# Patient Record
Sex: Male | Born: 1970 | Race: White | Hispanic: No | Marital: Married | Smoking: Former smoker
Health system: Southern US, Community
[De-identification: ages and names within clinical notes are randomized; demographics above are authoritative.]

## PROBLEM LIST (undated history)

## (undated) DIAGNOSIS — R7302 Impaired glucose tolerance (oral): Secondary | ICD-10-CM

## (undated) DIAGNOSIS — F419 Anxiety disorder, unspecified: Secondary | ICD-10-CM

## (undated) DIAGNOSIS — F32A Depression, unspecified: Secondary | ICD-10-CM

## (undated) DIAGNOSIS — F329 Major depressive disorder, single episode, unspecified: Secondary | ICD-10-CM

## (undated) DIAGNOSIS — G4730 Sleep apnea, unspecified: Secondary | ICD-10-CM

## (undated) DIAGNOSIS — E785 Hyperlipidemia, unspecified: Secondary | ICD-10-CM

## (undated) DIAGNOSIS — K219 Gastro-esophageal reflux disease without esophagitis: Secondary | ICD-10-CM

## (undated) HISTORY — DX: Gastro-esophageal reflux disease without esophagitis: K21.9

## (undated) HISTORY — DX: Gilbert syndrome: E80.4

## (undated) HISTORY — DX: Major depressive disorder, single episode, unspecified: F32.9

## (undated) HISTORY — DX: Impaired glucose tolerance (oral): R73.02

## (undated) HISTORY — DX: Anxiety disorder, unspecified: F41.9

## (undated) HISTORY — DX: Depression, unspecified: F32.A

## (undated) HISTORY — PX: BACK SURGERY: SHX140

## (undated) HISTORY — DX: Hyperlipidemia, unspecified: E78.5

## (undated) HISTORY — PX: FINGER SURGERY: SHX640

## (undated) HISTORY — DX: Sleep apnea, unspecified: G47.30

---

## 2001-02-24 ENCOUNTER — Emergency Department (HOSPITAL_COMMUNITY): Admission: EM | Admit: 2001-02-24 | Discharge: 2001-02-24 | Payer: Self-pay | Admitting: Emergency Medicine

## 2001-02-27 ENCOUNTER — Ambulatory Visit (HOSPITAL_COMMUNITY): Admission: RE | Admit: 2001-02-27 | Discharge: 2001-02-27 | Payer: Self-pay | Admitting: Orthopedic Surgery

## 2001-02-27 ENCOUNTER — Encounter: Payer: Self-pay | Admitting: Orthopedic Surgery

## 2001-03-03 ENCOUNTER — Ambulatory Visit (HOSPITAL_COMMUNITY): Admission: RE | Admit: 2001-03-03 | Discharge: 2001-03-03 | Payer: Self-pay | Admitting: Neurosurgery

## 2001-03-03 ENCOUNTER — Encounter: Payer: Self-pay | Admitting: Neurosurgery

## 2003-03-26 ENCOUNTER — Ambulatory Visit (HOSPITAL_BASED_OUTPATIENT_CLINIC_OR_DEPARTMENT_OTHER): Admission: RE | Admit: 2003-03-26 | Discharge: 2003-03-26 | Payer: Self-pay | Admitting: Orthopedic Surgery

## 2008-11-20 ENCOUNTER — Emergency Department (HOSPITAL_COMMUNITY): Admission: EM | Admit: 2008-11-20 | Discharge: 2008-11-20 | Payer: Self-pay | Admitting: Emergency Medicine

## 2010-02-08 ENCOUNTER — Encounter: Payer: Self-pay | Admitting: Emergency Medicine

## 2010-06-05 NOTE — Op Note (Signed)
[place]. [facility]  Patient:    Derrick Rogers, Derrick Rogers Visit Number: [id number] MRN: [id number]          Service Type: DSU Location: 3000 [id number] [id number] Attending Physician:  Donn Pierini Dictated by:   Julio Sicks, M.D. Proc. Date: 03/03/01 Admit Date:  03/03/2001 Discharge Date: 03/03/2001                             Operative Report  PREOPERATIVE DIAGNOSIS:  Left L5-S1 herniated nucleus pulposus with radiculopathy.  POSTOPERATIVE DIAGNOSIS:  Left L5-S1 herniated nucleus pulposus with radiculopathy.  PROCEDURE:  Left L5-S1 laminotomy and microdiskectomy.  SURGEON:  Julio Sicks, M.D.  ANESTHESIA:  General endotracheal.  INDICATIONS:  The patient is a 40 year old male with a history of severe back and left lower extremity pain, paresthesia, and weakness with a left-sided S1 radiculopathy which has failed conservative management.  MRI scan demonstrates a left L5-S1 large paracentral disk herniation with compression of the left-sided S1 nerve root.  We discussed the options as well as management including the possibility of undergoing a left-sided L5-S1 laminotomy and microdiskectomy in hopes of improving his symptoms.  The patient is aware of the risks and benefits and wishes to proceed.  DESCRIPTION OF PROCEDURE:  The patient was taken to the operating room and placed on the operating table in the supine position.  After an adequate level of anesthesia was achieved, the patient was positioned prone on to a Wilson frame, appropriately padded, and preparation.  The lumbar region is prepped and draped sterilely.  A linear skin incision was made overlying the L5-S1 interspace.  This was carried down sharply in the midline. Subperiosteal dissection was then performed including the lamina and facet joints of L5 and S1.  Deep self-retaining retractor was placed. Intraoperative x-ray was taken and the level was confirmed.  A wide laminotomy was then performed  using Kerrison rongeurs and the high speed drill to remove the anterior one-third of the lamina of L5 and the medial one-third of the L5-S1 facet joint and the superior rim of the S1 lamina.  The ligamentum flavum was then elevated and protected in the usual fashion using the Kerrison rongeurs where the underlying thecal sac and exiting S1 nerve root were identified.  The microscope was brought into the field and used for microdissection.  The left-sided S1 nerve root and underlying disk herniation with the epidural venous plexus was coagulated and cut.  The thecal sac and S1 nerve root were mobilized and retracted towards the midline.  Disk herniation was readily apparent.  The disk itself was tightly adherent to the overlying nerve root.  This was dissected free the best we could.  The disk was then incised with a 15 blade in a rectangular fashion.  A wide disk space clean out was then achieved using pituitary rongeurs and upward angled pituitary rongeurs, and Epstein curets.  The disk herniation itself was very tough and fibrous.  It was removed completely.  All loose or obviously degenerative disk material was removed from the interspace.  At this point, a very thorough decompression was achieved.  There was no residual in the thecal sac or nerve roots.  The wound was then irrigated with antibiotic solution.  Gelfoam was placed topically for hemostasis and found to be good.  The microscope and retraction system removed.  Hemostasis in the muscle was achieved with electrocautery.  The wound was closed in layers with Vicryl  suture.  Steri-Strips and a sterile dressing were applied.  There were no apparent complications.  The patient tolerated the procedure well and he returns to the recovery room postoperative. Dictated by:   Julio Sicks, M.D. Attending Physician:  Donn Pierini DD:  03/03/01 TD:  03/04/01 Job: [id number]

## 2010-06-05 NOTE — Op Note (Signed)
NAMEJONH, MCQUEARY                         ACCOUNT NO.:  [id number]   MEDICAL RECORD NO.:  [id number]                   PATIENT TYPE:  AMB   LOCATION:  DSC                                  FACILITY:  [facility]   PHYSICIAN:  Cindee Salt, M.D.                    DATE OF BIRTH:  10-Jan-1971   DATE OF PROCEDURE:  03/26/2003  DATE OF DISCHARGE:                                 OPERATIVE REPORT   PREOPERATIVE DIAGNOSIS:  Fractured middle phalanx, left index finger.   POSTOPERATIVE DIAGNOSIS:  Fractured middle phalanx, left index finger.   OPERATION:  Open reduction and internal fixation of middle phalanx fracture,  left index finger.   SURGEON:  Cindee Salt, M.D.   ASSISTANTCarolyne Fiscal   ANESTHESIA:  General.   HISTORY:  The patient is a 40 year old male who suffered a crush injury to  his left index finger.  He is brought in now for attempted closed and  possible open reduction.   DESCRIPTION OF PROCEDURE:  The patient was brought to the operating room  where a general anesthetic was carried out without difficulty.  He was  prepped using DuraPrep in a supine position, left arm free.  The fracture  was manipulated.  This was unsuccessful.  Two K-wires were placed into the  proximal portion of the middle phalanx.  These were used to attempt to  manipulate the fracture further.  This was unsuccessful using the image  intensifier.  A 35 K-wire was passed into the middle phalanx distal to the  fracture to allow this to be used as a joystick.  This was also unsuccessful  in reducing the fracture.  As such, it was decided to proceed with opening  the fracture site for placement.  A longitudinal incision was made over the  proximal aspect of the middle phalanx and carried down through the  subcutaneous tissue.  The bare area was opened.  The fracture was  immediately apparent.  A Freer elevator was then used to manipulate the  fracture.  This allowed it to be manipulated back into position in an  AP and  lateral direction.  The two K-wires were then passed across the fracture  site stabilizing it in the AP lateral direction.  The 35 K-wire was removed.  The pins were bent and cut short.  The wound was irrigated.  The skin was  closed with interrupted 5-0 nylon sutures.  A sterile compressive dressing  and splint to the finger was applied.  The patient tolerated the procedure  well and was taken to the recovery room for observation in satisfactory  condition.  He is discharged home to return to the [facility] in [place]  in one week on Vicodin and Keflex.  Cindee Salt, M.D.    Angelique Blonder  D:  03/26/2003  T:  03/26/2003  Job:  [id number]

## 2011-11-23 ENCOUNTER — Other Ambulatory Visit: Payer: Self-pay | Admitting: Physician Assistant

## 2011-11-23 ENCOUNTER — Ambulatory Visit
Admission: RE | Admit: 2011-11-23 | Discharge: 2011-11-23 | Disposition: A | Discharge status: Home / Self Care | Payer: Self-pay | Source: Ambulatory Visit | Attending: Physician Assistant | Admitting: Physician Assistant

## 2011-11-23 DIAGNOSIS — R200 Anesthesia of skin: Secondary | ICD-10-CM

## 2013-04-22 ENCOUNTER — Emergency Department (INDEPENDENT_AMBULATORY_CARE_PROVIDER_SITE_OTHER): Payer: BC Managed Care – PPO

## 2013-04-22 ENCOUNTER — Encounter (HOSPITAL_COMMUNITY): Payer: Self-pay | Admitting: Emergency Medicine

## 2013-04-22 ENCOUNTER — Emergency Department (INDEPENDENT_AMBULATORY_CARE_PROVIDER_SITE_OTHER)
Admission: EM | Admit: 2013-04-22 | Discharge: 2013-04-22 | Disposition: A | Discharge status: Home / Self Care | Payer: BC Managed Care – PPO | Source: Home / Self Care | Attending: Emergency Medicine | Admitting: Emergency Medicine

## 2013-04-22 DIAGNOSIS — M67919 Unspecified disorder of synovium and tendon, unspecified shoulder: Secondary | ICD-10-CM

## 2013-04-22 DIAGNOSIS — M719 Bursopathy, unspecified: Secondary | ICD-10-CM

## 2013-04-22 DIAGNOSIS — M7580 Other shoulder lesions, unspecified shoulder: Secondary | ICD-10-CM

## 2013-04-22 MED ORDER — DICLOFENAC SODIUM 75 MG PO TBEC: 75.0000 mg | DELAYED_RELEASE_TABLET | Freq: Two times a day (BID) | ORAL | Status: DC

## 2013-04-22 NOTE — Discharge Instructions (Signed)

## 2013-04-22 NOTE — ED Provider Notes (Signed)
  Chief Complaint   Chief Complaint  Patient presents with  . Shoulder Pain    History of Present Illness   Derrick Rogers is a 43 year old male had a two-day history of left shoulder pain both superiorly and posteriorly. He denies any injury. The pain is worse with any movement of the shoulder including abduction, flexion, and internal and external rotation. There is no radiation down the arm, no numbness, tingling, weakness, or swelling of the arm or hand.  Review of Systems   Other than as noted above, the patient denies any of the following symptoms: Systemic:  No fevers or chills. Musculoskeletal:  No joint pain, arthritis, swelling, back pain, or neck pain. No history of arthritis.  Neurological:  No muscular weakness or paresthesia.  PMFSH   Past medical history, family history, social history, meds, and allergies were reviewed.    Physical Examination     Vital signs:  BP 143/91  Pulse 81  Temp(Src) 98.3 F (36.8 C) (Oral)  Resp 18  SpO2 98% Gen:  Alert and oriented times 3.  In no distress. Musculoskeletal: No pain to palpation. Shoulder has a full range of motion with pain on abduction and flexion. Also has pain with internal and external rotation. Neer test was positive.  Hawkins test was positive.  Empty cans test was negative. Otherwise, all joints had a full a ROM with no swelling, bruising or deformity.  No edema, pulses full. Extremities were warm and pink.  Capillary refill was brisk.  Skin:  Clear, warm and dry.  No rash. Neuro:  Alert and oriented times 3.  Muscle strength was normal.  Sensation was intact to light touch.   Radiology   Dg Shoulder Left  04/22/2013   CLINICAL DATA:  Left shoulder pain  EXAM: LEFT SHOULDER - 2+ VIEW  COMPARISON:  None  FINDINGS: Four views of left shoulder submitted. No acute fracture or subluxation. Mild degenerative changes left AC joint. The glenohumeral joint is preserved.  IMPRESSION: No acute fracture or subluxation. Mild  degenerative changes left AC joint.   Electronically Signed   By: Natasha MeadLiviu  Pop M.D.   On: 04/22/2013 10:53   I reviewed the images independently and personally and concur with the radiologist's findings.  Assessment   The encounter diagnosis was Rotator cuff tendonitis.  He declines a corticosteroid injection today, so we'll treat conservatively with anti-inflammatories, rest, ice, and exercise.  Plan     1.  Meds:  The following meds were prescribed:   Discharge Medication List as of 04/22/2013 11:15 AM    START taking these medications   Details  diclofenac (VOLTAREN) 75 MG EC tablet Take 1 tablet (75 mg total) by mouth 2 (two) times daily., Starting 04/22/2013, Until Discontinued, Normal        2.  Patient Education/Counseling:  The patient was given appropriate handouts, self care instructions, and instructed in symptomatic relief.  Given exercises to do twice a day.  3.  Follow up:  The patient was told to follow up here if no better in 3 to 4 days, or sooner if becoming worse in any way, and given some red flag symptoms such as worsening pain or new neurological symptoms which would prompt immediate return.  Followup with Dr. Jene EveryJeffrey Beane if no improvement in 2 weeks.     Reuben Likesavid C Keali Mccraw, MD 04/22/13 [phone]

## 2013-04-22 NOTE — ED Notes (Addendum)
Pt c/o left shoulder pain x 2 days. Pt reports pain is gradually getting worse and rom is becoming more limited. 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 Also reports sometimes he swings on a bar above the steps and skips steps and jumps down. Pt has not tried anything for pain. Pt is alert and oriented and in no acute distress.

## 2013-09-11 ENCOUNTER — Encounter: Payer: Self-pay | Admitting: General Surgery

## 2013-09-18 ENCOUNTER — Ambulatory Visit (INDEPENDENT_AMBULATORY_CARE_PROVIDER_SITE_OTHER): Payer: BC Managed Care – PPO | Admitting: Cardiology

## 2013-09-18 ENCOUNTER — Encounter: Payer: Self-pay | Admitting: Cardiology

## 2013-09-18 VITALS — BP 120/84 | HR 71 | Ht 73.0 in | Wt 263.0 lb

## 2013-09-18 DIAGNOSIS — G4733 Obstructive sleep apnea (adult) (pediatric): Secondary | ICD-10-CM | POA: Insufficient documentation

## 2013-09-18 DIAGNOSIS — E669 Obesity, unspecified: Secondary | ICD-10-CM

## 2013-09-18 NOTE — Patient Instructions (Signed)
Your physician recommends that you continue on your current medications as directed. Please refer to the Current Medication list given to you today.  Your physician has recommended that you have a sleep study. This test records several body functions during sleep, including: brain activity, eye movement, oxygen and carbon dioxide blood levels, heart rate and rhythm, breathing rate and rhythm, the flow of air through your mouth and nose, snoring, body muscle movements, and chest and belly movement. (Schedule at [facility] for Dr Mayford Knife to read)  Polysomnography (Sleep Studies) Polysomnography (PSG) is a series of tests used for detecting (diagnosing) obstructive sleep apnea and other sleep disorders. The tests measure how some parts of your body are working while you are sleeping. The tests are extensive and expensive. They are done in a sleep lab or hospital, and vary from center to center. Your caregiver may perform other more simple sleep studies and questionnaires before doing more complete and involved testing. Testing may not be covered by insurance. Some of these tests are:  An EEG (Electroencephalogram). This tests your brain waves and stages of sleep.  An EOG (Electrooculogram). This measures the movements of your eyes. It detects periods of REM (rapid eye movement) sleep, which is your dream sleep.  An EKG (Electrocardiogram). This measures your heart rhythm.  EMG (Electromyography). This is a measurement of how the muscles are working in your upper airway and your legs while sleeping.  An oximetry measurement. It measures how much oxygen (air) you are getting while sleeping.  Breathing efforts may be measured. The same test can be interpreted (understood) differently by different caregivers and centers that study sleep.  Studies may be given an apnea/hypopnea index (AHI). This is a number which is found by counting the times of no breathing or under breathing during the night, and  relating those numbers to the amount of time spent in bed. When the AHI is greater than 15, the patient is likely to complain of daytime sleepiness. When the AHI is greater than 30, the patient is at increased risk for heart problems and must be followed more closely. Following the AHI also allows you to know how treatment is working. Simple oximetry (tracking the amount of oxygen that is taken in) can be used for screening patients who:  Do not have symptoms (problems) of OSA.  Have a normal Epworth Sleepiness Scale Score.  Have a low pre-test probability of having OSA.  Have none of the upper airway problems likely to cause apnea.  Oximetry is also used to determine if treatment is effective in patients who showed significant desaturations (not getting enough oxygen) on their home sleep study. One extra measure of safety is to perform additional studies for the person who only snores. This is because no one can predict with absolute certainty who will have OSA. Those who show significant desaturations (not getting enough oxygen) are recommended to have a more detailed sleep study. Document Released: 07/11/2002 Document Revised: 03/29/2011 Document Reviewed: 03/12/2013 [facility] Patient Information 2015 [place], [place]. This information is not intended to replace advice given to you by your health care provider. Make sure you discuss any questions you have with your health care provider.

## 2013-09-18 NOTE — Progress Notes (Signed)
[address] [place], [place]  [address] Phone: [phone] Fax:  [phone]  Date:  09/18/2013   ID:  Derrick Rogers, DOB 09/16/1970, MRN [id number]  PCP:  Cala Bradford, MD  Sleep Medicine:  Armanda Magic, MD     History of Present Illness: Derrick Rogers is a 43 y.o. male with a history of OSA and tried CPAP but did not sleep well with it so he stopped it about a month later. His insurance would not cover a machine and was borrowing one.   He is now wanting to start back on CPAP.  He says that he would like to try a full face mask.  He does not feel rested when he gets up in the am.  He says that he is sleepy around 2pm every day.  He is snoring as well as night according to his wife.     Wt Readings from Last 3 Encounters:  09/18/13 263 lb (119.296 kg)     Past Medical History  Diagnosis Date  . Anxiety   . Depression   . GERD (gastroesophageal reflux disease)     good control  . Hyperlipidemia   . Glucose intolerance (impaired glucose tolerance)   . Sleep apnea     Moderate with AHI 20/hr now on no CPAP machine from a cost issue-chronic daytime fatigue, tried the machine adn found he did not sleep well with it.   . Gilbert's syndrome     Current Outpatient Prescriptions  Medication Sig Dispense Refill  . acetaminophen (TYLENOL) 500 MG tablet Take 500 mg by mouth every 6 (six) hours as needed.      Marland Kitchen escitalopram (LEXAPRO) 10 MG tablet Take 10 mg by mouth daily.      Marland Kitchen omeprazole (PRILOSEC) 20 MG capsule Take 20 mg by mouth as needed.       No current facility-administered medications for this visit.    Allergies:   No Known Allergies  Social History:  The patient  reports that he quit smoking about 2 years ago. He does not have any smokeless tobacco history on file. He reports that he drinks alcohol. He reports that he does not use illicit drugs.   Family History:  The patient's family history includes CAD in his father and paternal grandfather;  Dementia in his paternal grandmother; Heart attack in his paternal grandfather; Hyperlipidemia in his brother; Hypertension in his brother; Melanoma in his father.   ROS:  Please see the history of present illness.      All other systems reviewed and negative.   PHYSICAL EXAM: VS:  BP 120/84  Pulse 71  Ht  (1.854 m)  Wt 263 lb (119.296 kg)  BMI 34.71 kg/m2 Well nourished, well developed, in no acute distress HEENT: normal Neck: no JVD Cardiac:  normal S1, S2; RRR; no murmur Lungs:  clear to auscultation bilaterally, no wheezing, rhonchi or rales Abd: soft, nontender, no hepatomegaly Ext: no edema Skin: warm and dry Neuro:  CNs 2-12 intact, no focal abnormalities noted       ASSESSMENT AND PLAN:  1. OSA on CPAP originally but then stopped a few years ago.  He has symptoms of daytime sleepiness, snoring and poor sleep. - set up split night PSG 2. Obesity - I encouraged him to try to get into a routine aerobic exercise program and try to follow a low fat diet.  Followup with me after CPAP started  Signed, Armanda Magic, MD 09/18/2013 10:05 AM

## 2013-11-02 ENCOUNTER — Ambulatory Visit (HOSPITAL_BASED_OUTPATIENT_CLINIC_OR_DEPARTMENT_OTHER): Payer: BC Managed Care – PPO | Attending: Cardiology

## 2013-11-02 VITALS — Ht 73.0 in | Wt 263.0 lb

## 2013-11-02 DIAGNOSIS — G4733 Obstructive sleep apnea (adult) (pediatric): Secondary | ICD-10-CM | POA: Diagnosis not present

## 2013-11-02 DIAGNOSIS — Z6834 Body mass index (BMI) 34.0-34.9, adult: Secondary | ICD-10-CM | POA: Insufficient documentation

## 2013-11-02 DIAGNOSIS — R0683 Snoring: Secondary | ICD-10-CM | POA: Diagnosis present

## 2013-11-02 DIAGNOSIS — R400 Somnolence: Secondary | ICD-10-CM | POA: Diagnosis present

## 2013-11-09 ENCOUNTER — Telehealth: Payer: Self-pay | Admitting: Cardiology

## 2013-11-09 NOTE — Telephone Encounter (Signed)
Please let patient know that he has moderate sleep apnea and did well on his CPAP titration.  He will be set up with [facility]AP [facility] and I have put the order in.  Please set him up for an OV with me in 10 weeks

## 2013-11-09 NOTE — Telephone Encounter (Signed)
Patient informed he has moderate sleep apnea and did well on his CPAP titration.  He will be set up with CPAP through [facility]. 10 week F/U scheduled 01/24/2013 at 1000. Patient agrees with treatment plan.

## 2013-11-09 NOTE — Sleep Study (Signed)
   NAME: Derrick Rogers DATE OF BIRTH:  [birth year] MEDICAL RECORD NUMBER [id number]  LOCATION: [facility]  PHYSICIAN: Leannah Guse R  DATE OF STUDY: 11/02/2013  SLEEP STUDY TYPE: Nocturnal Polysomnogram               REFERRING PHYSICIAN: Quintella Reicherturner, Trinka Keshishyan R, MD  INDICATION FOR STUDY: daytime sleepiness, loud snoring, falling asleep driving  EPWORTH SLEEPINESS SCORE: 12 HEIGHT: 6\' 1"  (185.4 cm)  WEIGHT: 263 lb (119.296 kg)    Body mass index is 34.71 kg/(m^2).  NECK SIZE: 17 in. BMI: 35  MEDICATIONS: Reviewed in the chart  SLEEP ARCHITECTURE: The patient slept for a total of 151 minutes with no slow wave sleep and only 1 minute of REM sleep.  Sleep onset latency was normal at 8 minutes and latency to REM onset was delayed at 153 minutes.  The sleep efficiency was normal at 93%.  During the CPAP titration, the total sleep time was 254 minutes with no slow wave sleep but 95 minutes of REM sleep.  The sleep efficiency was 96%.  RESPIRATORY DATA: The patient had an AHI of 15.1 events per hour of which 3 were obstructive apneas and and 35 were obstructive hypopneas.  Most occurred during NREM sleep and in the nonsupine position.  During the CPAP titration, there was 3 central apneas, 2 obstructive apneas and 23 hypopneas.  The patient was titrated on CPAP starting at 4cm H2O and increased to 10cm H2O.  At 10cm H2O the AHI was 1.1 event per hour.  The patient was able to sleep for prolonged periods of time in REM sleep in the supine position without further respiratory events at an optimum pressure of 10cm H2O.  OXYGEN DATA: Baseline O2 sats were 92% and during the PSG dropped to 90% during REM sleep and 77% in NREM sleep.  During the CPAP titration the lowest O2 sat at optimum pressure of 10cm H2O was 91%.  The lowest O2 sat during the titration was 88%.    CARDIAC DATA: There patient maintained NSR with occasioanl PVC's during the study.  MOVEMENT/PARASOMNIA: There were no  were no periodic limb movements or behavior disorders noted during sleep.  IMPRESSION/ RECOMMENDATION:   1.  Mild to moderate obstructive sleep apnea/hypopnea syndrome with an overall AHI of 15.1 events per hour.  Most events were noted during NREM sleep in the non supine position.   2.  Respiratory related O2 desaturations were noted as low as 77% during NREM sleep.   3.  Successful CPAP titration to 10cm H2O using a Medium Simplus Full Face mask with AHI 1.1 events per hour and lowest O2 sat the this pressure of 91%. 4.  The patient should be counseled in good sleep hygiene and weight loss.    Signed: Quintella ReichertURNER,Astin Sayre R Diplomate, American Board of Sleep Medicine  ELECTRONICALLY SIGNED ON:  11/09/2013, 9:19 AM [facility] PH: [phone]   FX: [phone] ACCREDITED BY THE AMERICAN ACADEMY OF SLEEP MEDICINE

## 2013-11-26 ENCOUNTER — Telehealth: Payer: Self-pay | Admitting: Cardiology

## 2013-11-26 NOTE — Telephone Encounter (Signed)
Hasn't heard from [facility] about his CPap supplies. Says its been a few weeks already since you called him.

## 2013-11-26 NOTE — Telephone Encounter (Signed)
New problem   Pt need to speak to nurse concerning his recent sleep study. [facility] was [facility] and he haven't heard from them.  Please call pt.

## 2013-11-27 NOTE — Telephone Encounter (Signed)
Sending message to Derrick [facility]issa [facility] at [facility] to [facility].

## 2013-11-29 ENCOUNTER — Telehealth: Payer: Self-pay

## 2013-11-29 NOTE — Telephone Encounter (Signed)
Concerned they had not heard from [facility] about CPAP supplies.   Forwarding to J. C. PenneyMelissa Stenson for follow-up.

## 2013-12-25 ENCOUNTER — Telehealth: Payer: Self-pay | Admitting: Cardiology

## 2013-12-25 DIAGNOSIS — G4733 Obstructive sleep apnea (adult) (pediatric): Secondary | ICD-10-CM

## 2013-12-25 NOTE — Telephone Encounter (Signed)
New message      Pt brought [place] CPAP machine from [place] or somewhere.  He did not get it from the home health people.  He needs to know the settings he need to put the CPAP machine on.  Please call

## 2013-12-26 ENCOUNTER — Telehealth: Payer: Self-pay | Admitting: General Practice

## 2013-12-26 NOTE — Telephone Encounter (Signed)
Pt's wife st that [employer] S8 Elite. Informed her I'd call tomorrow after talking to Dr. Mayford Knifeurner.

## 2013-12-26 NOTE — Telephone Encounter (Signed)
To Dr. Turner for review and recommendations. 

## 2013-12-26 NOTE — Telephone Encounter (Signed)
New message         Pt would like to know the settings to the cpap machine / please give pt a call

## 2013-12-27 NOTE — Telephone Encounter (Signed)
10cm H2O

## 2013-12-27 NOTE — Telephone Encounter (Signed)
Left message on patient's private VM to set the machine at 10 cm H2O.  Instructed patient to call tomorrow if he has any questions.

## 2014-01-08 NOTE — Telephone Encounter (Signed)
Please fine out who his DME is and set up an Appt with them to check machine

## 2014-01-08 NOTE — Telephone Encounter (Signed)
Left message to call back  

## 2014-01-08 NOTE — Telephone Encounter (Signed)
To Dr. Turner for recommendations. 

## 2014-01-08 NOTE — Telephone Encounter (Signed)
Follow Up         Pt calling stating he has some questions about his CPAP machine. Pt states that every time he uses his machine he wakes up the next morning with flu like symptoms. Please call back and advise.

## 2014-01-09 NOTE — Telephone Encounter (Signed)
Pt's wife called to inform us that patient was diagnosed with an upper respiratory infection today and wants to know if it's from the CPAP. She st that they do not have a DME here -- [facility] but [facility] were "too unorganized and were going to take too long and were too expensive." They had their CPAP sent to them from a member of the family who is a sleep doctor in [place] to save money. Pt's wife angry that we do not check the machines at the office. Explained to her that the DME is utilized to troubleshoot machine problems, not us.  Pt's wife st that the patient thinks he is still snoring and wants to know if the settings can be adjusted and if so, by how much and at what increments.   To Dr. Mayford Knifeurner for review and recommendations.

## 2014-01-14 NOTE — Telephone Encounter (Signed)
Please order a 2 week CPAP autotitration from 4 to 20cm H2O - but this will need to be done [facility]

## 2014-01-14 NOTE — Telephone Encounter (Signed)
Informed patient of Dr. Norris Crossurner's recommendations to have 2 week CPAP autotitration from 4 to 20 cm H2O [facility]. Patient agrees with treatment plan.  Order placed and [facility].

## 2014-01-17 ENCOUNTER — Other Ambulatory Visit: Payer: Self-pay | Admitting: Family Medicine

## 2014-01-17 ENCOUNTER — Ambulatory Visit
Admission: RE | Admit: 2014-01-17 | Discharge: 2014-01-17 | Disposition: A | Discharge status: Home / Self Care | Payer: BC Managed Care – PPO | Source: Ambulatory Visit | Attending: Family Medicine | Admitting: Family Medicine

## 2014-01-17 DIAGNOSIS — J40 Bronchitis, not specified as acute or chronic: Secondary | ICD-10-CM

## 2014-01-24 ENCOUNTER — Ambulatory Visit (INDEPENDENT_AMBULATORY_CARE_PROVIDER_SITE_OTHER): Payer: BLUE CROSS/BLUE SHIELD | Admitting: Cardiology

## 2014-01-24 ENCOUNTER — Encounter: Payer: Self-pay | Admitting: Cardiology

## 2014-01-24 VITALS — BP 130/70 | HR 84 | Ht 73.0 in | Wt 270.0 lb

## 2014-01-24 DIAGNOSIS — E669 Obesity, unspecified: Secondary | ICD-10-CM

## 2014-01-24 DIAGNOSIS — G4733 Obstructive sleep apnea (adult) (pediatric): Secondary | ICD-10-CM

## 2014-01-24 NOTE — Progress Notes (Signed)
  [address] [place], [place]  [address] Phone: [phone] Fax:  [phone]  Date:  01/24/2014   ID:  Derrick Rogers, DOB Jun 01, 1970, MRN [id number]  PCP:  Cala BradfordWHITE,CYNTHIA S, MD  Sleep Medicine:  Armanda Magicraci Adryel Wortmann, MD    History of Present Illness: Derrick Rogers is a 44 y.o. male with a history of OSA.  He has not been using it lately due to developing bronchitis which then went into PNA. He uses a full face mask and tolerates the pressure. He has only used it for a week prior to getting sick.     Wt Readings from Last 3 Encounters:  01/24/14 270 lb (122.471 kg)  11/02/13 263 lb (119.296 kg)  09/18/13 263 lb (119.296 kg)     Past Medical History  Diagnosis Date  . Anxiety   . Depression   . GERD (gastroesophageal reflux disease)     good control  . Hyperlipidemia   . Glucose intolerance (impaired glucose tolerance)   . Sleep apnea     Moderate with AHI 20/hr now on no CPAP machine from a cost issue-chronic daytime fatigue, tried the machine adn found he did not sleep well with it.   . Gilbert's syndrome     Current Outpatient Prescriptions  Medication Sig Dispense Refill  . escitalopram (LEXAPRO) 10 MG tablet Take 10 mg by mouth daily.    Marland Kitchen. levofloxacin (LEVAQUIN) 500 MG tablet Take 500 mg by mouth daily.    Marland Kitchen. omeprazole (PRILOSEC) 20 MG capsule Take 20 mg by mouth as needed.    . predniSONE (DELTASONE) 10 MG tablet Take 10 mg by mouth daily with breakfast.    . acetaminophen (TYLENOL) 500 MG tablet Take 500 mg by mouth every 6 (six) hours as needed.     No current facility-administered medications for this visit.    Allergies:   No Known Allergies  Social History:  The patient  reports that he quit smoking about 3 years ago. He does not have any smokeless tobacco history on file. He reports that he drinks alcohol. He reports that he does not use illicit drugs.   Family History:  The patient's family history includes CAD in his father and paternal  grandfather; Dementia in his paternal grandmother; Heart attack in his paternal grandfather; Hyperlipidemia in his brother; Hypertension in his brother; Melanoma in his father.   ROS:  Please see the history of present illness.      All other systems reviewed and negative.   PHYSICAL EXAM: VS:  BP 130/70 mmHg  Pulse 84  Ht 6\' 1"  (1.854 m)  Wt 270 lb (122.471 kg)  BMI 35.63 kg/m2 Well nourished, well developed, in no acute distress HEENT: normal Neck: no JVD Cardiac:  normal S1, S2; RRR; no murmur Lungs:  Scattered rhonchi bilaterally Abd: soft, nontender, no hepatomegaly Ext: no edema Skin: warm and dry Neuro:  CNs 2-12 intact, no focal abnormalities noted  ASSESSMENT AND PLAN:  1. Mild to moderate OSA  (AHI 15/hr).  He is back on CPAP but has only used it for 1 week due to PNA.   I will get a d/l in 8 weeks once he has been back on the device.   2. Obesity - I encouraged him to try to get into a routine aerobic exercise program and try to follow a low fat diet.  Followup with me in 3 months    Signed, Armanda Magicraci Citlally Captain, MD [facility] 01/24/2014 10:33 AM

## 2014-01-24 NOTE — Patient Instructions (Signed)
Your physician recommends that you get a download of your CPAP in 8 weeks.  Your physician recommends that you schedule a follow-up appointment in: 3 months with Dr. Mayford Knifeurner.

## 2014-02-05 ENCOUNTER — Other Ambulatory Visit: Payer: Self-pay

## 2014-02-05 DIAGNOSIS — G4733 Obstructive sleep apnea (adult) (pediatric): Secondary | ICD-10-CM

## 2014-02-14 ENCOUNTER — Encounter: Payer: Self-pay | Admitting: Cardiology

## 2014-02-21 ENCOUNTER — Ambulatory Visit
Admission: RE | Admit: 2014-02-21 | Discharge: 2014-02-21 | Disposition: A | Discharge status: Home / Self Care | Payer: BLUE CROSS/BLUE SHIELD | Source: Ambulatory Visit | Attending: Family Medicine | Admitting: Family Medicine

## 2014-02-21 ENCOUNTER — Other Ambulatory Visit: Payer: Self-pay | Admitting: Family Medicine

## 2014-02-21 DIAGNOSIS — J219 Acute bronchiolitis, unspecified: Secondary | ICD-10-CM

## 2014-03-25 ENCOUNTER — Ambulatory Visit (INDEPENDENT_AMBULATORY_CARE_PROVIDER_SITE_OTHER): Payer: BLUE CROSS/BLUE SHIELD | Admitting: Internal Medicine

## 2014-03-25 ENCOUNTER — Ambulatory Visit: Payer: BLUE CROSS/BLUE SHIELD | Admitting: Internal Medicine

## 2014-03-25 ENCOUNTER — Encounter: Payer: Self-pay | Admitting: Internal Medicine

## 2014-03-25 VITALS — BP 134/86 | HR 86 | Ht 73.0 in | Wt 277.0 lb

## 2014-03-25 DIAGNOSIS — J45991 Cough variant asthma: Secondary | ICD-10-CM | POA: Diagnosis not present

## 2014-03-25 MED ORDER — BUDESONIDE-FORMOTEROL FUMARATE 80-4.5 MCG/ACT IN AERO: INHALATION_SPRAY | RESPIRATORY_TRACT | Status: DC

## 2014-03-25 MED ORDER — TRAMADOL HCL 50 MG PO TABS: ORAL_TABLET | ORAL | Status: DC

## 2014-03-25 MED ORDER — PANTOPRAZOLE SODIUM 40 MG PO TBEC: 40.0000 mg | DELAYED_RELEASE_TABLET | Freq: Every day | ORAL | Status: DC

## 2014-03-25 MED ORDER — FAMOTIDINE 20 MG PO TABS: ORAL_TABLET | ORAL | Status: DC

## 2014-03-25 NOTE — Patient Instructions (Addendum)
Symbicort 80 Take 2 puffs first thing in am and then another 2 puffs about 12 hours later - if any side effects just take one twice daily   Work on inhaler technique:  relax and gently blow all the way out then take a nice smooth deep breath back in, triggering the inhaler at same time you start breathing in.  Hold for up to 5 seconds if you can.  Rinse and gargle with water when done.  Pantoprazole (protonix) 40 mg   Take 30-60 min before first meal of the day and Pepcid 20 mg one bedtime until return to office - this is the best way to tell whether stomach acid is contributing to your problem.    GERD (REFLUX)  is an extremely common cause of respiratory symptoms just like yours , many times with no obvious heartburn at all.    It can be treated with medication, but also with lifestyle changes including avoidance of late meals, excessive alcohol, smoking cessation, and avoid fatty foods, chocolate, peppermint, colas, red wine, and acidic juices such as orange juice.  NO MINT OR MENTHOL PRODUCTS SO NO COUGH DROPS  USE SUGARLESS CANDY INSTEAD (Jolley ranchers or Stover's or Life Savers) or even ice chips will also do - the key is to swallow to prevent all throat clearing. NO OIL BASED VITAMINS - use powdered substitutes.  Take delsym two tsp every 12 hours and supplement if needed with  tramadol 50 mg up to 2 every 4 hours to suppress the urge to cough. Swallowing water or using ice chips/non mint and menthol containing candies (such as lifesavers or sugarless jolly ranchers) are also effective.  You should rest your voice and avoid activities that you know make you cough.  Once you have eliminated the cough for 3 straight days try reducing the tramadol first,  then the delsym as tolerated.    Please schedule a follow up office visit in 2 weeks, sooner if needed

## 2014-03-25 NOTE — Progress Notes (Signed)
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 Pulmonary Consult    Referred by Dr. Tiburcio PeaHarris. Pt c/o cough since 01/09/14- worse with exertion and when he lies down. Cough is occ prod with clear to light yellow sputum. He states that he gets SOB when he walks up stairs and when has coughing episodes.  He states that he has CP every time he coughs.   [facility] with body aches, chills, then cough productive of variably purulent sputum and sob with steps and when coughs. Immediately feels himself wheezing at  [facility]  Last prednisone Feb 20th> jazzed him up but no better cough Was already on omeprazole prn prior to the illness and continues just prn dosing Some better with xopenex  Wears respirator at work and doesn't seem to aggravate the cough  Mostly sob linked with coughing  No obvious other patterns in day to day or daytime variabilty or assoc   chest tightness  overt sinus or hb symptoms. No unusual exp hx or h/o childhood pna/ asthma or knowledge of premature birth.   . Also denies any obvious fluctuation of symptoms with weather or environmental changes or other aggravating or alleviating factors except as outlined above.  Current Medications, Allergies, Complete Past Medical History, Past Surgical History, Family History, and Social History were reviewed in [facility] electronic [facility] record.                Review of Systems  Constitutional: Negative for fever, chills, activity change, appetite change and unexpected weight  change.  HENT: Positive for dental problem. Negative for congestion, postnasal drip, rhinorrhea, sneezing, sore throat, trouble swallowing and voice change.   Eyes: Negative for visual disturbance.  Respiratory: Positive for cough and shortness of breath. Negative for choking.   Cardiovascular: Positive for chest pain. Negative for leg swelling.  Gastrointestinal: Negative for nausea, vomiting and abdominal pain.  Genitourinary: Negative for difficulty urinating.       Acid heartburn  Musculoskeletal: Negative for arthralgias.  Skin: Negative for rash.  Psychiatric/Behavioral: Negative for behavioral problems and confusion.       Objective:   Physical Exam  Wt Readings from Last 3 Encounters:  03/25/14 277 lb (125.646 kg)  01/24/14 270 lb (122.471 kg)  11/02/13 263 lb (119.296 kg)    Vital signs reviewed   HEENT: nl dentition, turbinates, and orophanx. Nl external ear canals without cough reflex   NECK :  without JVD/Nodes/TM/ nl carotid upstrokes bilaterally   LUNGS: no acc muscle use, bilateral insp and exp rhonchi   CV:  RRR  no s3 or murmur or increase in P2, no edema   ABD:  soft and nontender with nl excursion in the supine position. No bruits or organomegaly, bowel sounds nl  MS:  warm without deformities, calf tenderness, cyanosis or clubbing  SKIN: warm and dry without lesions    NEURO:  alert, approp, no deficit      I personally reviewed images  and agree with radiology impression as follows:  CXR:  02/25/14 No active cardiopulmonary disease.            Assessment & Plan:

## 2014-03-26 ENCOUNTER — Encounter: Payer: Self-pay | Admitting: Internal Medicine

## 2014-03-26 DIAGNOSIS — J45991 Cough variant asthma: Secondary | ICD-10-CM | POA: Insufficient documentation

## 2014-03-26 NOTE — Assessment & Plan Note (Signed)
The most common causes of chronic cough in immunocompetent adults include the following: upper airway cough syndrome (UACS), previously referred to as postnasal drip syndrome (PNDS), which is caused by variety of rhinosinus conditions; (2) asthma; (3) GERD; (4) chronic bronchitis from cigarette smoking or other inhaled environmental irritants; (5) nonasthmatic eosinophilic bronchitis; and (6) bronchiectasis.   These conditions, singly or in combination, have accounted for up to 94% of the causes of chronic cough in prospective studies.   Other conditions have constituted no >6% of the causes in prospective studies These have included bronchogenic carcinoma, chronic interstitial pneumonia, sarcoidosis, left ventricular failure, ACEI-induced cough, and aspiration from a condition associated with pharyngeal dysfunction.    Chronic cough is often simultaneously caused by more than one condition. A single cause has been found from 38 to 82% of the time, multiple causes from 18 to 62%. Multiply caused cough has been the result of three diseases up to 42% of the time.       Most likely this is a combination of cough variant asthma and UACS in a pt with preexisting gerd who doesn't take his ppi consistently and then starts coughing real hard generating more LPR/ more coughing  rec trial of low dose symbicort and max gerd rx x 2 weeks then regroup   The proper method of use, as well as anticipated side effects, of a metered-dose inhaler are discussed and demonstrated to the patient. Improved effectiveness after extensive coaching during this visit to a level of approximately 75%     Each maintenance medication was reviewed in detail including most importantly the difference between maintenance and as needed and under what circumstances the prns are to be used.  Please see instructions for details which were reviewed in writing and the patient given a copy.

## 2014-03-29 ENCOUNTER — Ambulatory Visit: Payer: BLUE CROSS/BLUE SHIELD | Admitting: Internal Medicine

## 2014-04-05 ENCOUNTER — Telehealth: Payer: Self-pay | Admitting: Internal Medicine

## 2014-04-05 MED ORDER — PREDNISONE 10 MG PO TABS: ORAL_TABLET | ORAL | Status: DC

## 2014-04-05 NOTE — Telephone Encounter (Signed)
Patients wife says that patient's cough is a little better, not producing as much phlegm.  They have to cancel their follow up appointment because they have to pay out of pocket and the bill was too expensive.  They would like to know if Dr. Sherene SiresWert has any other recommendations.  MW - please advise.   Patients wife is concerned about the bill from last visit, she said that the bill for last visit cost them $405.00 and they want billing to check to make sure this was correct.    Johny DrillingChan - can you handle this?    Thanks.

## 2014-04-05 NOTE — Telephone Encounter (Signed)
i don't control the charges but happy to downgrade to a new pt III and give samples to offset his cough but can't help him further over the phone  Next step was to return for ov with all meds in hand to regroup   Ok to call in Prednisone 10 mg take  4 each am x 2 days,   2 each am x 2 days,  1 each am x 2 days and stop in meantime

## 2014-04-05 NOTE — Telephone Encounter (Signed)
Called and spoke with pts wife Derrick Rogers.  She stated that they have a very high deductible and they have to pay out of pocket for everything until they reach $6000.  She stated that she wanted to see if this could be changed to a cash pay, or if the price for this visit could be decreased somehow.    They are aware of the prednisone that has been sent to the pharmacy.  They will call if the cough does not get better after the prednisone taper.  Will forward message back to MW to make aware of OV recs.

## 2014-04-08 ENCOUNTER — Ambulatory Visit: Payer: BLUE CROSS/BLUE SHIELD | Admitting: Internal Medicine

## 2014-04-18 ENCOUNTER — Telehealth: Payer: Self-pay | Admitting: Internal Medicine

## 2014-04-18 NOTE — Telephone Encounter (Signed)
LMTCB

## 2014-04-18 NOTE — Telephone Encounter (Signed)
I spoke with Derrick Rogers about this  She has sent Email to billing and they have changed the code from [phone] to [phone] and therefore charge went from 425$ to 235$  Spoke with the pt's spouse, Derrick Rogers and notified that this was done  Nothing further needed

## 2014-05-15 ENCOUNTER — Encounter: Payer: Self-pay | Admitting: Cardiology

## 2014-05-16 ENCOUNTER — Other Ambulatory Visit: Payer: Self-pay | Admitting: *Deleted

## 2014-05-16 DIAGNOSIS — G4733 Obstructive sleep apnea (adult) (pediatric): Secondary | ICD-10-CM

## 2015-06-28 DIAGNOSIS — H5213 Myopia, bilateral: Secondary | ICD-10-CM | POA: Diagnosis not present

## 2015-10-21 DIAGNOSIS — R945 Abnormal results of liver function studies: Secondary | ICD-10-CM | POA: Diagnosis not present

## 2015-10-21 DIAGNOSIS — E119 Type 2 diabetes mellitus without complications: Secondary | ICD-10-CM | POA: Diagnosis not present

## 2015-10-21 DIAGNOSIS — N451 Epididymitis: Secondary | ICD-10-CM | POA: Diagnosis not present

## 2016-02-10 DIAGNOSIS — Z0000 Encounter for general adult medical examination without abnormal findings: Secondary | ICD-10-CM | POA: Diagnosis not present

## 2016-02-10 DIAGNOSIS — E119 Type 2 diabetes mellitus without complications: Secondary | ICD-10-CM | POA: Diagnosis not present

## 2016-06-24 DIAGNOSIS — L237 Allergic contact dermatitis due to plants, except food: Secondary | ICD-10-CM | POA: Diagnosis not present

## 2016-09-06 IMAGING — CR DG CHEST 2V
2 series · 2 of 2 positions shown · non-contrast
Comparison: None.

CLINICAL DATA: Bronchitis.

EXAM:
CHEST  2 VIEW

[view not recorded (1 of 2)]
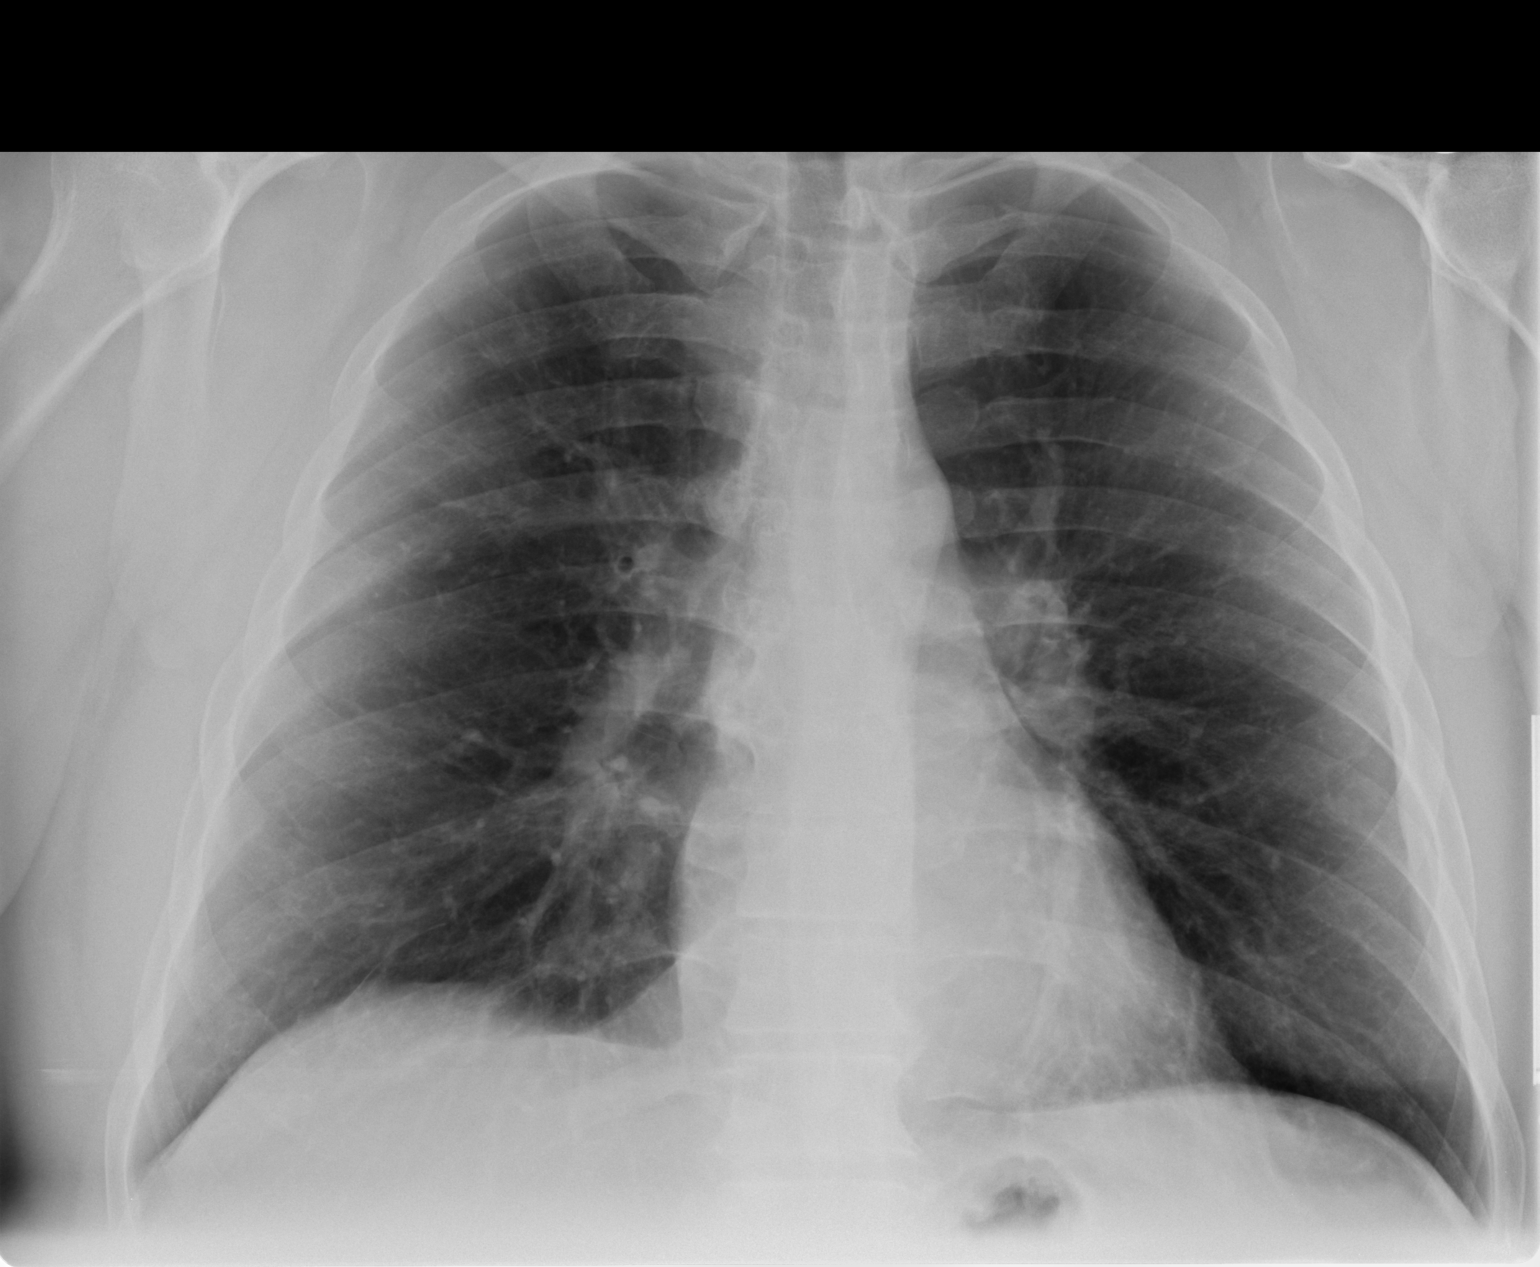

[view not recorded (2 of 2)]
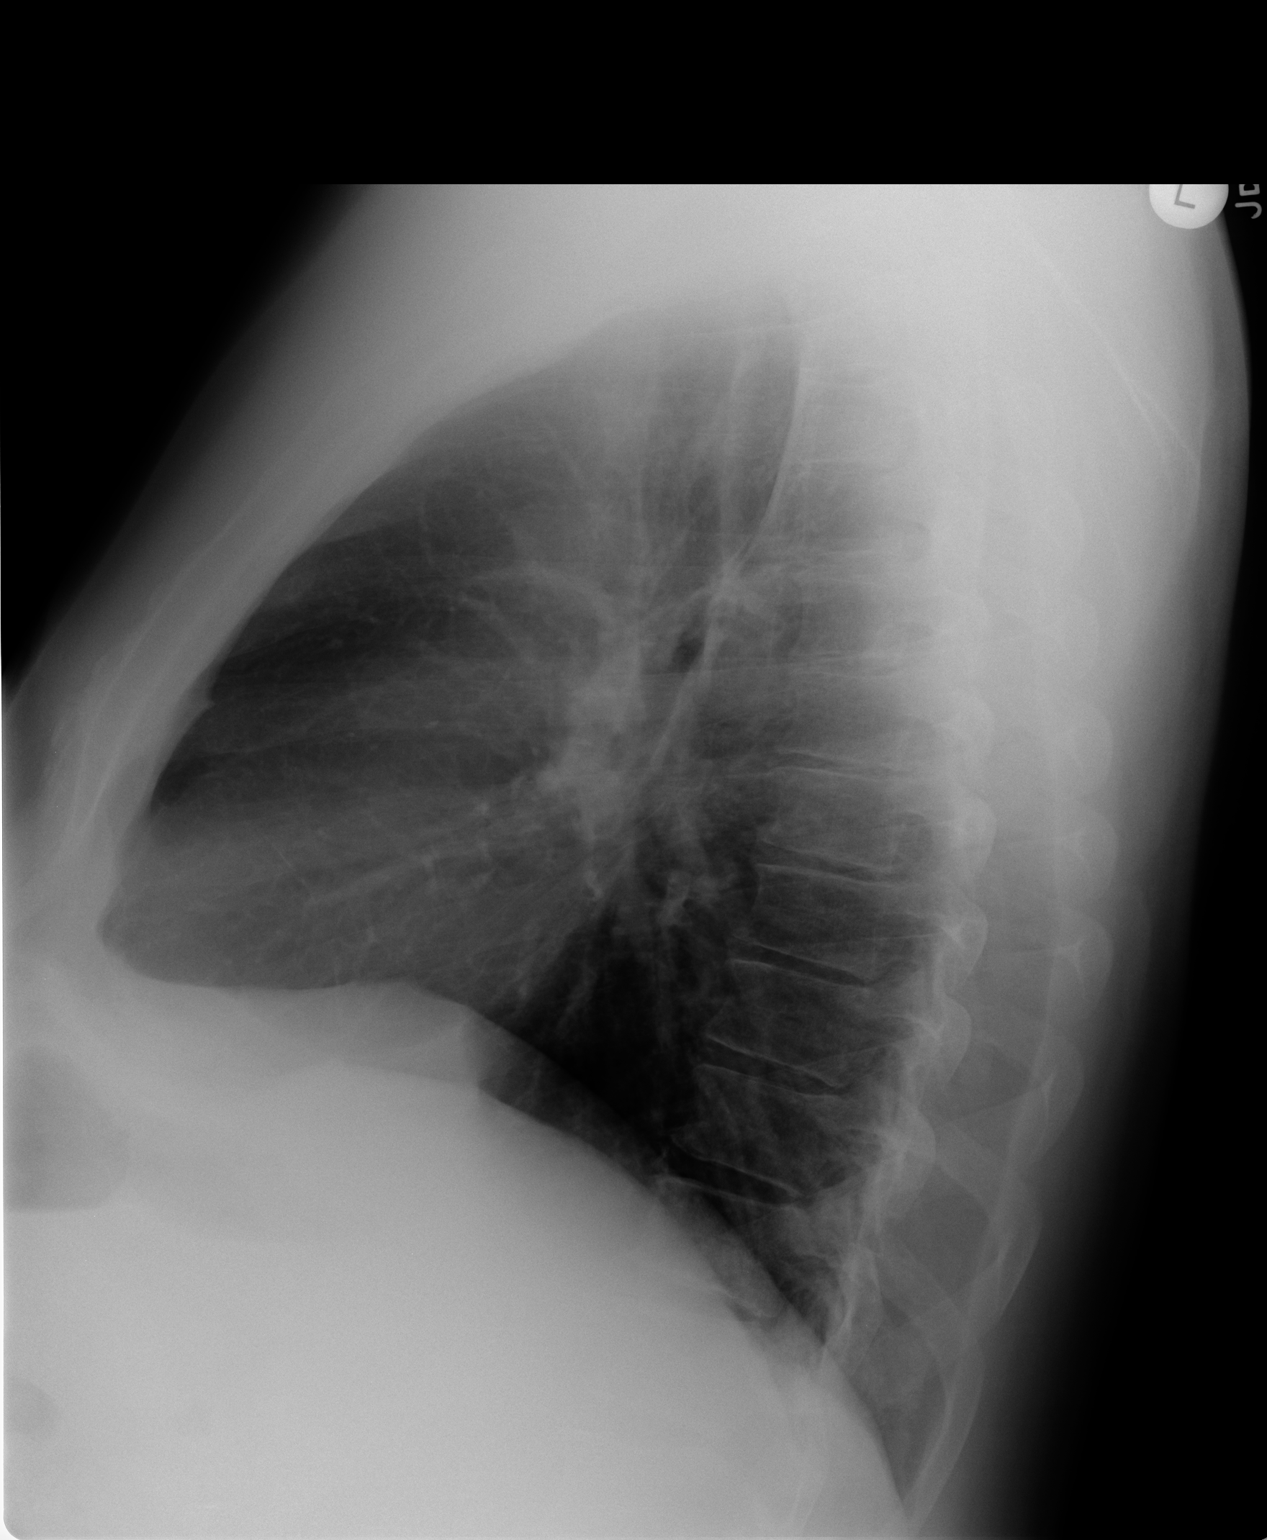

[2 of 2 positions shown; findings below may reference images not displayed]

FINDINGS: Mediastinum and hilar structures are normal. Mild infiltrate right
upper lobe cannot be excluded. Heart size normal. No pleural
effusion or pneumothorax. No acute bony abnormality .
IMPRESSION: Mild infiltrate right upper lobe cannot be excluded.

## 2017-01-04 DIAGNOSIS — J069 Acute upper respiratory infection, unspecified: Secondary | ICD-10-CM | POA: Diagnosis not present

## 2017-01-04 DIAGNOSIS — B9789 Other viral agents as the cause of diseases classified elsewhere: Secondary | ICD-10-CM | POA: Diagnosis not present

## 2017-04-06 DIAGNOSIS — E669 Obesity, unspecified: Secondary | ICD-10-CM | POA: Diagnosis not present

## 2017-04-06 DIAGNOSIS — G4730 Sleep apnea, unspecified: Secondary | ICD-10-CM | POA: Diagnosis not present

## 2017-04-06 DIAGNOSIS — I10 Essential (primary) hypertension: Secondary | ICD-10-CM | POA: Diagnosis not present

## 2017-04-06 DIAGNOSIS — E119 Type 2 diabetes mellitus without complications: Secondary | ICD-10-CM | POA: Diagnosis not present

## 2017-04-12 DIAGNOSIS — Z0000 Encounter for general adult medical examination without abnormal findings: Secondary | ICD-10-CM | POA: Diagnosis not present

## 2017-04-12 DIAGNOSIS — R200 Anesthesia of skin: Secondary | ICD-10-CM | POA: Diagnosis not present

## 2017-04-12 DIAGNOSIS — F419 Anxiety disorder, unspecified: Secondary | ICD-10-CM | POA: Diagnosis not present

## 2017-04-12 DIAGNOSIS — E785 Hyperlipidemia, unspecified: Secondary | ICD-10-CM | POA: Diagnosis not present

## 2017-04-27 DIAGNOSIS — J40 Bronchitis, not specified as acute or chronic: Secondary | ICD-10-CM | POA: Diagnosis not present

## 2017-10-14 DIAGNOSIS — E119 Type 2 diabetes mellitus without complications: Secondary | ICD-10-CM | POA: Diagnosis not present

## 2017-10-14 DIAGNOSIS — Z125 Encounter for screening for malignant neoplasm of prostate: Secondary | ICD-10-CM | POA: Diagnosis not present

## 2017-10-14 DIAGNOSIS — F411 Generalized anxiety disorder: Secondary | ICD-10-CM | POA: Diagnosis not present

## 2017-10-14 DIAGNOSIS — Z8249 Family history of ischemic heart disease and other diseases of the circulatory system: Secondary | ICD-10-CM | POA: Diagnosis not present

## 2017-11-02 ENCOUNTER — Encounter: Payer: Self-pay | Admitting: *Deleted

## 2017-11-22 ENCOUNTER — Encounter (INDEPENDENT_AMBULATORY_CARE_PROVIDER_SITE_OTHER): Payer: Self-pay

## 2017-11-22 ENCOUNTER — Ambulatory Visit (INDEPENDENT_AMBULATORY_CARE_PROVIDER_SITE_OTHER): Payer: BLUE CROSS/BLUE SHIELD | Admitting: Cardiovascular Disease

## 2017-11-22 ENCOUNTER — Encounter: Payer: Self-pay | Admitting: Cardiovascular Disease

## 2017-11-22 VITALS — BP 124/82 | HR 70 | Ht 73.0 in | Wt 248.1 lb

## 2017-11-22 DIAGNOSIS — Z8249 Family history of ischemic heart disease and other diseases of the circulatory system: Secondary | ICD-10-CM

## 2017-11-22 DIAGNOSIS — E785 Hyperlipidemia, unspecified: Secondary | ICD-10-CM

## 2017-11-22 MED ORDER — ROSUVASTATIN CALCIUM 5 MG PO TABS: 5.0000 mg | ORAL_TABLET | Freq: Every day | ORAL | 3 refills | Status: DC

## 2017-11-22 NOTE — Patient Instructions (Signed)
Medication Instructions:  Your physician has recommended you make the following change in your medication:   START Rosuvastatin (Crestor) 5 mg once daily  If you need a refill on your cardiac medications before your next appointment, please call your pharmacy.   Lab work: Your physician recommends that you return for lab work in: 3 months  You will need to FAST for this appointment - nothing to eat or drink after midnight the night before except water.   Testing/Procedures: None Ordered    Follow-Up: At [facility], you and your health needs are our priority.  As part of our continuing mission to provide you with exceptional heart care, we have created designated Provider Care Teams.  These Care Teams include your primary Cardiologist (physician) and Advanced Practice Providers (APPs -  Physician Assistants and Nurse Practitioners) who all work together to provide you with the care you need, when you need it. You will need a follow up appointment in:  6 months.  Please call our office 2 months in advance to schedule this appointment.  You may see Kristeen Miss, MD or one of the following Advanced Practice Providers on your designated Care Team: Tereso Newcomer, PA-C [place], [place] . Berton Bon, NP

## 2017-11-22 NOTE — Progress Notes (Signed)
Cardiology Office Note:    Date:  11/22/2017   ID:  Derrick Rogers, DOB [birth year], MRN [id number]  PCP:  Laurann Montana, MD  Cardiologist:  Kristeen Miss, MD  Electrophysiologist:  None   Referring MD: Johny Blamer, MD   Problem list 1.  Obstructive sleep apnea 2.  Obesity 3.  Hyperlipidemia 4.  Palpitations 5.  Diabetes type II ( 2017 )   Chief Complaint  Patient presents with  . Sleep Apnea     Nov. 5, 2019   Derrick Rogers is a 47 y.o. male with a hx of obstructive sleep apnea and obesity.  He sees Dr. Mayford Knife in 2016 for his obstructive sleep apnea.  We are asked to see [name] again for further evaluation of his cardiac risk factors by Dr. Tiburcio Pea   His father recently had a stent .  He was talking to Dr. Tiburcio Pea and was referred back over to check out   Has lost 30-40 lbs over the past  Several years   Is an Transport planner  Metallurgist) .   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   Non smoker,  Chews occasionally .   Drinks some ETOH - a beer a day average .      Past Medical History:  Diagnosis Date  . Anxiety   . Depression   . GERD (gastroesophageal reflux disease)    good control  . Gilbert's syndrome   . Glucose intolerance (impaired glucose tolerance)   . Hyperlipidemia   . Sleep apnea    Mild to moderate with AHI 15/hr- NOT USING HIS  CPAP     Past Surgical History:  Procedure Laterality Date  . BACK SURGERY    . FINGER SURGERY      Current Medications: No outpatient medications have been marked as taking for the 11/22/17 encounter (Office Visit) with Talyn Dessert, Deloris Ping, MD.     Allergies:   Patient has no known allergies.   Social History   Socioeconomic History  . Marital status: Married    Spouse name: Not on file  . Number of children: Not on file  . Years of education: Not on file  . Highest education level: Not on file  Occupational History  . Occupation: Designer, industrial/product   Social Needs  . Financial resource strain: Not on file  . Food insecurity:    Worry: Not on file    Inability: Not on file  . Transportation needs:    Medical: Not on file    Non-medical: Not on file  Tobacco Use  . Smoking status: Former Smoker    Packs/day: 1.00    Years: 14.00    Pack years: 14.00    Types: Cigarettes    Last attempt to quit: 01/19/2011    Years since quitting: 6.8  . Smokeless tobacco: Never Used  Substance and Sexual Activity  . Alcohol use: Yes    Alcohol/week: 0.0 standard drinks    Comment: occasionally  . Drug use: No  . Sexual activity: Not on file  Lifestyle  . Physical activity:    Days per week: Not on file    Minutes per session: Not on file  . Stress: Not on file  Relationships  . Social connections:    Talks on phone: Not on file    Gets together: Not on file    Attends religious service: Not on file  Active member of club or organization: Not on file    Attends meetings of clubs or organizations: Not on file    Relationship status: Not on file  Other Topics Concern  . Not on file  Social History Narrative  . Not on file     Family History: The patient's family history includes CAD in his father and paternal grandfather; Dementia in his paternal grandmother; Heart attack in his paternal grandfather; Hyperlipidemia in his brother; Hypertension in his brother; Melanoma in his father.  ROS:      EKGs/Labs/Other Studies Reviewed:      EKG:  Nov. 5 2019:   NSR at 70 ,  Normal ECG   Recent Labs: No results found for requested labs within last 8760 hours.  Recent Lipid Panel No results found for: CHOL, TRIG, HDL, CHOLHDL, VLDL, LDLCALC, LDLDIRECT  Physical Exam:    VS:  BP 124/82   Pulse 70   Ht 6\' 1"  (1.854 m)   Wt 248 lb 1.9 oz (112.5 kg)   SpO2 97%   BMI 32.74 kg/m     Wt Readings from Last 3 Encounters:  11/22/17 248 lb 1.9 oz (112.5 kg)  03/25/14 277 lb (125.6 kg)  01/24/14 270 lb (122.5 kg)     GEN:   Well nourished, well developed in no acute distress HEENT: Normal NECK: No JVD; No carotid bruits LYMPHATICS: No lymphadenopathy CARDIAC: RRR, no murmurs, rubs, gallops RESPIRATORY:  Clear to auscultation without rales, wheezing or rhonchi  ABDOMEN: Soft, non-tender, non-distended MUSCULOSKELETAL:  No edema; No deformity  SKIN: Warm and dry NEUROLOGIC:  Alert and oriented x 3 PSYCHIATRIC:  Normal affect   ASSESSMENT:    No diagnosis found. PLAN:    In order of problems listed above:  1. 1.  Mild hyperlipidemia: He has a family history of coronary artery disease.  His father just had a stent placement. He has type 2 diabetes mellitus.  I think that he needs an LDL around 70.  We will start him on rosuvastatin 5 mg a day. Check fasting lipids, liver enzymes, basic metabolic profile in 3 months.  I will see him again in 6 months.   Medication Adjustments/Labs and Tests Ordered: Current medicines are reviewed at length with the patient today.  Concerns regarding medicines are outlined above.  No orders of the defined types were placed in this encounter.  No orders of the defined types were placed in this encounter.   Patient Instructions  Medication Instructions:  Your physician has recommended you make the following change in your medication:   START Rosuvastatin (Crestor) 5 mg once daily  If you need a refill on your cardiac medications before your next appointment, please call your pharmacy.   Lab work: Your physician recommends that you return for lab work in: 3 months  You will need to FAST for this appointment - nothing to eat or drink after midnight the night before except water.   Testing/Procedures: None Ordered    Follow-Up: At [facility], you and your health needs are our priority.  As part of our continuing mission to provide you with exceptional heart care, we have created designated Provider Care Teams.  These Care Teams include your primary  Cardiologist (physician) and Advanced Practice Providers (APPs -  Physician Assistants and Nurse Practitioners) who all work together to provide you with the care you need, when you need it. You will need a follow up appointment in:  6 months.  Please call our  office 2 months in advance to schedule this appointment.  You may see Kristeen Miss, MD or one of the following Advanced Practice Providers on your designated Care Team: Tereso Newcomer, PA-C [place], [place] . Berton Bon, NP       Signed, Kristeen Miss, MD  11/22/2017 11:53 AM     [facility]

## 2018-02-21 ENCOUNTER — Other Ambulatory Visit: Payer: BLUE CROSS/BLUE SHIELD

## 2018-03-29 ENCOUNTER — Telehealth: Payer: Self-pay | Admitting: Cardiology

## 2018-03-29 NOTE — Telephone Encounter (Signed)
° °  Patient calling to find out if he needs to schedule additional sleep testing also needs to order new equipment, stating his machine is leaking and has several other defects.  1) What problem are you experiencing? New cpap machine needed  2) Who is your medical equipment company? [employer]   Please route to the sleep study assistant.

## 2018-03-29 NOTE — Telephone Encounter (Signed)
Per Dr Mayford Knife, patient needs a sleep study to determine settings but patient has declined to test. Patient states he will talk things over with his wife because he pays everything out of pocket and call our office back.

## 2018-03-29 NOTE — Telephone Encounter (Signed)
Patient not seen in office since 01/24/2014, he wants a new device, got his old device from a family member several years ago and got supplies from [employer]. He is not in air view so do not know settings.

## 2018-04-06 NOTE — Telephone Encounter (Signed)
Pt says he has information necessary for Dr. Norris Cross Nurse. Wants to continue process to get a new machine

## 2018-04-19 DIAGNOSIS — E119 Type 2 diabetes mellitus without complications: Secondary | ICD-10-CM | POA: Diagnosis not present

## 2018-04-19 DIAGNOSIS — F411 Generalized anxiety disorder: Secondary | ICD-10-CM | POA: Diagnosis not present

## 2018-04-19 DIAGNOSIS — E7800 Pure hypercholesterolemia, unspecified: Secondary | ICD-10-CM | POA: Diagnosis not present

## 2018-04-20 DIAGNOSIS — E7800 Pure hypercholesterolemia, unspecified: Secondary | ICD-10-CM | POA: Diagnosis not present

## 2018-04-20 DIAGNOSIS — E119 Type 2 diabetes mellitus without complications: Secondary | ICD-10-CM | POA: Diagnosis not present

## 2018-10-20 DIAGNOSIS — L239 Allergic contact dermatitis, unspecified cause: Secondary | ICD-10-CM | POA: Diagnosis not present

## 2019-08-10 DIAGNOSIS — E119 Type 2 diabetes mellitus without complications: Secondary | ICD-10-CM | POA: Diagnosis not present

## 2019-08-10 DIAGNOSIS — F411 Generalized anxiety disorder: Secondary | ICD-10-CM | POA: Diagnosis not present

## 2019-08-10 DIAGNOSIS — F17200 Nicotine dependence, unspecified, uncomplicated: Secondary | ICD-10-CM | POA: Diagnosis not present

## 2019-08-10 DIAGNOSIS — E7800 Pure hypercholesterolemia, unspecified: Secondary | ICD-10-CM | POA: Diagnosis not present

## 2019-09-28 DIAGNOSIS — M25559 Pain in unspecified hip: Secondary | ICD-10-CM | POA: Diagnosis not present

## 2019-09-28 DIAGNOSIS — M25511 Pain in right shoulder: Secondary | ICD-10-CM | POA: Diagnosis not present

## 2019-09-28 DIAGNOSIS — M25512 Pain in left shoulder: Secondary | ICD-10-CM | POA: Diagnosis not present

## 2019-09-28 DIAGNOSIS — M259 Joint disorder, unspecified: Secondary | ICD-10-CM | POA: Diagnosis not present

## 2019-10-02 ENCOUNTER — Ambulatory Visit: Payer: BLUE CROSS/BLUE SHIELD | Admitting: Cardiovascular Disease

## 2019-10-09 DIAGNOSIS — M25511 Pain in right shoulder: Secondary | ICD-10-CM | POA: Diagnosis not present

## 2019-10-09 DIAGNOSIS — M25512 Pain in left shoulder: Secondary | ICD-10-CM | POA: Diagnosis not present

## 2019-10-11 ENCOUNTER — Other Ambulatory Visit: Payer: Self-pay

## 2019-10-11 ENCOUNTER — Ambulatory Visit
Admission: EM | Admit: 2019-10-11 | Discharge: 2019-10-11 | Disposition: A | Discharge status: Home / Self Care | Payer: BC Managed Care – PPO

## 2019-10-11 DIAGNOSIS — S81811A Laceration without foreign body, right lower leg, initial encounter: Secondary | ICD-10-CM

## 2019-10-11 DIAGNOSIS — Z23 Encounter for immunization: Secondary | ICD-10-CM

## 2019-10-11 MED ORDER — TETANUS-DIPHTH-ACELL PERTUSSIS 5-2.5-18.5 LF-MCG/0.5 IM SUSP
0.5000 mL | Freq: Once | INTRAMUSCULAR | Status: AC
  Administered 2019-10-11: 0.5 mL via INTRAMUSCULAR

## 2019-10-11 NOTE — Discharge Instructions (Signed)

## 2019-10-11 NOTE — ED Provider Notes (Signed)
[facility]    CSN: [id number] Arrival date & time: 10/11/19  1049 Recent     History   Chief Complaint Chief Complaint  Patient presents with  . Laceration    HPI Derrick Rogers is a 49 y.o. male history of OSA, DM type II, presenting today for evaluation of laceration to leg.  Reports he scraped his right lower leg on a piece of metal from his trailer.  This happened earlier today.  Denies use of anticoagulants.  Unsure of last tetanus.  Denies any difficulty bending knee or ankle.  Denies numbness or tingling. 6 cm- 14 1 cm 1 HPI  Past Medical History:  Diagnosis Date  . Anxiety   . Depression   . GERD (gastroesophageal reflux disease)    good control  . Gilbert's syndrome   . Glucose intolerance (impaired glucose tolerance)   . Hyperlipidemia   . Sleep apnea    Mild to moderate with AHI 15/hr- NOT USING HIS  CPAP     Patient Active Problem List   Diagnosis Date Noted  . Cough variant asthma 03/26/2014  . OSA (obstructive sleep apnea) 09/18/2013  . Obesity (BMI 30-39.9) 09/18/2013    Past Surgical History:  Procedure Laterality Date  . BACK SURGERY    . FINGER SURGERY         Home Medications    Prior to Admission medications   Medication Sig Start Date End Date Taking? Authorizing Provider  acetaminophen (TYLENOL) 500 MG tablet Take 500 mg by mouth every 6 (six) hours as needed.   Yes [provider]  escitalopram (LEXAPRO) 10 MG tablet Take 10 mg by mouth daily.   Yes [provider]  predniSONE (STERAPRED UNI-PAK 21 TAB) 10 MG (21) TBPK tablet Take 10 mg by mouth daily.   Yes [provider]    Family History Family History  Problem Relation Age of Onset  . CAD Father   . Melanoma Father   . Hypertension Brother   . Hyperlipidemia Brother   . Dementia Paternal Grandmother   . Heart attack Paternal Grandfather   . CAD Paternal Grandfather     Social History Social History   Tobacco Use  .  Smoking status: Former Smoker    Packs/day: 1.00    Years: 14.00    Pack years: 14.00    Types: Cigarettes    Quit date: 01/19/2011    Years since quitting: 8.7  . Smokeless tobacco: Never Used  Substance Use Topics  . Alcohol use: Yes    Alcohol/week: 0.0 standard drinks    Comment: occasionally  . Drug use: No     Allergies   Patient has no known allergies.   Review of Systems Review of Systems  Constitutional: Negative for fatigue and fever.  Eyes: Negative for redness, itching and visual disturbance.  Respiratory: Negative for shortness of breath.   Cardiovascular: Negative for chest pain and leg swelling.  Gastrointestinal: Negative for nausea and vomiting.  Musculoskeletal: Negative for arthralgias and myalgias.  Skin: Positive for color change and wound. Negative for rash.  Neurological: Negative for dizziness, syncope, weakness, light-headedness and headaches.     Physical Exam Triage Vital Signs ED Triage Vitals [10/11/19 1124]  Enc Vitals Group     BP 133/79     Pulse Rate 73     Resp 18     Temp 98.8 F (37.1 C)     Temp Source Oral     SpO2 95 %  Weight      Height      Head Circumference      Peak Flow      Pain Score 4     Pain Loc      Pain Edu?      Excl. in GC?    No data found.  Updated Vital Signs BP 133/79 (BP Location: Left Arm)   Pulse 73   Temp 98.8 F (37.1 C) (Oral)   Resp 18   SpO2 95%   Visual Acuity Right Eye Distance:   Left Eye Distance:   Bilateral Distance:    Right Eye Near:   Left Eye Near:    Bilateral Near:     Physical Exam Vitals and nursing note reviewed.  Constitutional:      Appearance: He is well-developed.     Comments: No acute distress  HENT:     Head: Normocephalic and atraumatic.     Nose: Nose normal.  Eyes:     Conjunctiva/sclera: Conjunctivae normal.  Cardiovascular:     Rate and Rhythm: Normal rate.  Pulmonary:     Effort: Pulmonary effort is normal. No respiratory distress.    Abdominal:     General: There is no distension.  Musculoskeletal:        General: Normal range of motion.     Cervical back: Neck supple.     Comments: Full active range of motion of knee Dorsalis pedis 2+  Skin:    General: Skin is warm and dry.     Comments: 6 cm linear laceration noted anterior right shin, superficial more proximally, slightly open exposing subcutaneous tissue more distally, no underlying tendons or fascia visualized  Small 1 cm laceration noted just distal to larger laceration  Neurological:     Mental Status: He is alert and oriented to person, place, and time.      UC Treatments / Results  Labs (all labs ordered are listed, but only abnormal results are displayed) Labs Reviewed - No data to display  EKG   Radiology No results found.  Procedures Laceration Repair  Date/Time: 10/11/2019 5:26 PM Performed by: Raliyah Montella, [place] C, PA-C Authorized by: Zeta Bucy, Junius Creamer, PA-C   Consent:    Consent obtained:  Verbal   Consent given by:  Patient   Risks discussed:  Pain, poor cosmetic result and infection Anesthesia (see MAR for exact dosages):    Anesthesia method:  Local infiltration   Local anesthetic:  Lidocaine 2% w/o epi Laceration details:    Location:  Leg   Leg location:  R lower leg   Length (cm):  7 Repair type:    Repair type:  Simple Pre-procedure details:    Preparation:  Patient was prepped and draped in usual sterile fashion Exploration:    Hemostasis achieved with:  Direct pressure   Wound exploration: wound explored through full range of motion   Treatment:    Area cleansed with:  Shur-Clens   Amount of cleaning:  Standard   Irrigation solution:  Sterile saline   Irrigation method:  Syringe   Visualized foreign bodies/material removed: no   Skin repair:    Repair method:  Sutures   Suture size:  4-0   Suture material:  Prolene   Suture technique:  Simple interrupted   Number of sutures:  15 Approximation:     Approximation:  Close Post-procedure details:    Dressing:  Non-adherent dressing   Patient tolerance of procedure:  Tolerated well, no immediate complications   (  including critical care time)  Medications Ordered in UC Medications  Tdap (BOOSTRIX) injection 0.5 mL (0.5 mLs Intramuscular Given 10/11/19 1148)    Initial Impression / Assessment and Plan / UC Course  I have reviewed the triage vital signs and the nursing notes.  Pertinent labs & imaging results that were available during my care of the patient were reviewed by me and considered in my medical decision making (see chart for details).     Laceration repaired, tetanus updated, discussed wound care.  Monitor for signs of infection.  Sutures to be removed in 7 to 10 days.  Low suspicion of underlying fracture at this time.  Discussed strict return precautions. Patient verbalized understanding and is agreeable with plan.  Final Clinical Impressions(s) / UC Diagnoses   Final diagnoses:  Laceration of right lower leg, initial encounter     Discharge Instructions     WOUND CARE Please return in 7 days to have your stitches/staples removed or sooner if you have concerns. Marland Kitchen Keep area clean and dry for 24 hours. Do not remove bandage, if applied. . After 24 hours, remove bandage and wash wound gently with mild soap and warm water. Reapply a new bandage after cleaning wound, if directed. . Continue daily cleansing with soap and water until stitches/staples are removed. . Do not apply any ointments or creams to the wound while stitches/staples are in place, as this may cause delayed healing. . Notify the office if you experience any of the following signs of infection: Swelling, redness, pus drainage, streaking, fever >101.0 F . Notify the office if you experience excessive bleeding that does not stop after 15-20 minutes of constant, firm pressure.    ED Prescriptions    None     PDMP not reviewed this encounter.     Lew Dawes, PA-C 10/12/19 1728

## 2019-10-11 NOTE — ED Triage Notes (Signed)
Pt states hit his rt leg on the metal end of a trailer. Laceration noted to rt lower leg. Bleeding controlled at this time.

## 2019-10-16 DIAGNOSIS — M25551 Pain in right hip: Secondary | ICD-10-CM | POA: Diagnosis not present

## 2019-10-16 DIAGNOSIS — M25512 Pain in left shoulder: Secondary | ICD-10-CM | POA: Diagnosis not present

## 2019-10-16 DIAGNOSIS — M25552 Pain in left hip: Secondary | ICD-10-CM | POA: Diagnosis not present

## 2019-10-16 DIAGNOSIS — M25511 Pain in right shoulder: Secondary | ICD-10-CM | POA: Diagnosis not present

## 2019-10-19 ENCOUNTER — Ambulatory Visit
Admission: EM | Admit: 2019-10-19 | Discharge: 2019-10-19 | Disposition: A | Discharge status: Home / Self Care | Payer: BC Managed Care – PPO

## 2019-10-19 ENCOUNTER — Other Ambulatory Visit: Payer: Self-pay

## 2019-10-19 ENCOUNTER — Encounter: Payer: Self-pay | Admitting: Emergency Medicine

## 2019-10-19 NOTE — ED Triage Notes (Signed)
Pt here for suture removal to right lower leg; 15 sutures removed; wound healing well

## 2019-10-26 DIAGNOSIS — M25512 Pain in left shoulder: Secondary | ICD-10-CM | POA: Diagnosis not present

## 2019-11-06 ENCOUNTER — Encounter: Payer: Self-pay | Admitting: General Practice

## 2019-11-12 DIAGNOSIS — M25512 Pain in left shoulder: Secondary | ICD-10-CM | POA: Diagnosis not present

## 2019-11-12 DIAGNOSIS — M2550 Pain in unspecified joint: Secondary | ICD-10-CM | POA: Diagnosis not present

## 2019-11-12 DIAGNOSIS — M25551 Pain in right hip: Secondary | ICD-10-CM | POA: Diagnosis not present

## 2019-11-12 DIAGNOSIS — M25511 Pain in right shoulder: Secondary | ICD-10-CM | POA: Diagnosis not present

## 2019-11-12 DIAGNOSIS — M25552 Pain in left hip: Secondary | ICD-10-CM | POA: Diagnosis not present

## 2019-11-12 DIAGNOSIS — R5382 Chronic fatigue, unspecified: Secondary | ICD-10-CM | POA: Diagnosis not present

## 2019-11-27 DIAGNOSIS — M0609 Rheumatoid arthritis without rheumatoid factor, multiple sites: Secondary | ICD-10-CM | POA: Diagnosis not present

## 2019-11-27 DIAGNOSIS — M2550 Pain in unspecified joint: Secondary | ICD-10-CM | POA: Diagnosis not present

## 2019-11-27 DIAGNOSIS — R5382 Chronic fatigue, unspecified: Secondary | ICD-10-CM | POA: Diagnosis not present

## 2019-11-27 DIAGNOSIS — M25511 Pain in right shoulder: Secondary | ICD-10-CM | POA: Diagnosis not present

## 2020-09-10 DIAGNOSIS — E1169 Type 2 diabetes mellitus with other specified complication: Secondary | ICD-10-CM | POA: Diagnosis not present

## 2020-09-10 DIAGNOSIS — S86012A Strain of left Achilles tendon, initial encounter: Secondary | ICD-10-CM | POA: Diagnosis not present

## 2020-09-10 DIAGNOSIS — F419 Anxiety disorder, unspecified: Secondary | ICD-10-CM | POA: Diagnosis not present

## 2020-09-10 DIAGNOSIS — I10 Essential (primary) hypertension: Secondary | ICD-10-CM | POA: Diagnosis not present

## 2020-09-19 DIAGNOSIS — M7662 Achilles tendinitis, left leg: Secondary | ICD-10-CM | POA: Diagnosis not present

## 2020-09-29 DIAGNOSIS — M7662 Achilles tendinitis, left leg: Secondary | ICD-10-CM | POA: Diagnosis not present

## 2020-10-01 DIAGNOSIS — E119 Type 2 diabetes mellitus without complications: Secondary | ICD-10-CM | POA: Diagnosis not present

## 2020-10-27 DIAGNOSIS — E785 Hyperlipidemia, unspecified: Secondary | ICD-10-CM | POA: Diagnosis not present

## 2020-12-13 DIAGNOSIS — J0100 Acute maxillary sinusitis, unspecified: Secondary | ICD-10-CM | POA: Diagnosis not present

## 2020-12-31 DIAGNOSIS — E785 Hyperlipidemia, unspecified: Secondary | ICD-10-CM | POA: Diagnosis not present

## 2020-12-31 DIAGNOSIS — R140 Abdominal distension (gaseous): Secondary | ICD-10-CM | POA: Diagnosis not present

## 2020-12-31 DIAGNOSIS — R030 Elevated blood-pressure reading, without diagnosis of hypertension: Secondary | ICD-10-CM | POA: Diagnosis not present

## 2020-12-31 DIAGNOSIS — E1165 Type 2 diabetes mellitus with hyperglycemia: Secondary | ICD-10-CM | POA: Diagnosis not present

## 2021-01-07 ENCOUNTER — Ambulatory Visit
Admission: RE | Admit: 2021-01-07 | Discharge: 2021-01-07 | Disposition: A | Discharge status: Home / Self Care | Payer: BC Managed Care – PPO | Source: Ambulatory Visit | Attending: Family Medicine | Admitting: Family Medicine

## 2021-01-07 ENCOUNTER — Other Ambulatory Visit: Payer: Self-pay

## 2021-01-07 ENCOUNTER — Other Ambulatory Visit: Payer: Self-pay | Admitting: Physician Assistant

## 2021-01-07 ENCOUNTER — Other Ambulatory Visit: Payer: Self-pay | Admitting: Family Medicine

## 2021-01-07 DIAGNOSIS — R140 Abdominal distension (gaseous): Secondary | ICD-10-CM | POA: Diagnosis not present

## 2021-01-07 DIAGNOSIS — M160 Bilateral primary osteoarthritis of hip: Secondary | ICD-10-CM | POA: Diagnosis not present

## 2021-01-07 DIAGNOSIS — I878 Other specified disorders of veins: Secondary | ICD-10-CM | POA: Diagnosis not present

## 2021-04-13 DIAGNOSIS — Z1159 Encounter for screening for other viral diseases: Secondary | ICD-10-CM | POA: Diagnosis not present

## 2021-04-13 DIAGNOSIS — F419 Anxiety disorder, unspecified: Secondary | ICD-10-CM | POA: Diagnosis not present

## 2021-04-13 DIAGNOSIS — E785 Hyperlipidemia, unspecified: Secondary | ICD-10-CM | POA: Diagnosis not present

## 2021-04-13 DIAGNOSIS — E1169 Type 2 diabetes mellitus with other specified complication: Secondary | ICD-10-CM | POA: Diagnosis not present

## 2021-04-13 DIAGNOSIS — Z0000 Encounter for general adult medical examination without abnormal findings: Secondary | ICD-10-CM | POA: Diagnosis not present

## 2021-04-13 DIAGNOSIS — K219 Gastro-esophageal reflux disease without esophagitis: Secondary | ICD-10-CM | POA: Diagnosis not present

## 2021-04-13 DIAGNOSIS — Z125 Encounter for screening for malignant neoplasm of prostate: Secondary | ICD-10-CM | POA: Diagnosis not present

## 2021-05-11 DIAGNOSIS — J40 Bronchitis, not specified as acute or chronic: Secondary | ICD-10-CM | POA: Diagnosis not present

## 2021-08-16 DIAGNOSIS — R051 Acute cough: Secondary | ICD-10-CM | POA: Diagnosis not present

## 2021-08-18 DIAGNOSIS — R059 Cough, unspecified: Secondary | ICD-10-CM | POA: Diagnosis not present

## 2021-08-18 DIAGNOSIS — E119 Type 2 diabetes mellitus without complications: Secondary | ICD-10-CM | POA: Diagnosis not present

## 2021-08-18 DIAGNOSIS — R058 Other specified cough: Secondary | ICD-10-CM | POA: Diagnosis not present

## 2021-08-24 DIAGNOSIS — J189 Pneumonia, unspecified organism: Secondary | ICD-10-CM | POA: Diagnosis not present

## 2021-08-24 DIAGNOSIS — E1165 Type 2 diabetes mellitus with hyperglycemia: Secondary | ICD-10-CM | POA: Diagnosis not present

## 2021-09-11 ENCOUNTER — Ambulatory Visit
Admission: RE | Admit: 2021-09-11 | Discharge: 2021-09-11 | Disposition: A | Discharge status: Home / Self Care | Payer: BC Managed Care – PPO | Source: Ambulatory Visit | Attending: Family Medicine | Admitting: Family Medicine

## 2021-09-11 ENCOUNTER — Other Ambulatory Visit: Payer: Self-pay | Admitting: Family Medicine

## 2021-09-11 DIAGNOSIS — R058 Other specified cough: Secondary | ICD-10-CM

## 2021-09-11 DIAGNOSIS — R059 Cough, unspecified: Secondary | ICD-10-CM | POA: Diagnosis not present

## 2021-09-18 DIAGNOSIS — E119 Type 2 diabetes mellitus without complications: Secondary | ICD-10-CM | POA: Diagnosis not present

## 2021-09-25 NOTE — Progress Notes (Deleted)
Synopsis: Referred for chronic cough by Johny Blamer, MD  Subjective:   PATIENT ID: Derrick Rogers GENDER: male DOB: 1970/04/27, MRN: [id number]  No chief complaint on file.  51yM with history of OSA, referred for cough seen in past by Dr. Sherene Sires  Otherwise pertinent review of systems is negative.  Past Medical History:  Diagnosis Date   Anxiety    Depression    GERD (gastroesophageal reflux disease)    good control   Gilbert's syndrome    Glucose intolerance (impaired glucose tolerance)    Hyperlipidemia    Sleep apnea    Mild to moderate with AHI 15/hr- NOT USING HIS  CPAP      Family History  Problem Relation Age of Onset   CAD Father    Melanoma Father    Hypertension Brother    Hyperlipidemia Brother    Dementia Paternal Grandmother    Heart attack Paternal Grandfather    CAD Paternal Grandfather      Past Surgical History:  Procedure Laterality Date   BACK SURGERY     FINGER SURGERY      Social History   Socioeconomic History   Marital status: Married    Spouse name: Not on file   Number of children: Not on file   Years of education: Not on file   Highest education level: Not on file  Occupational History   Occupation: Nurse, children's   Tobacco Use   Smoking status: Former    Packs/day: 1.00    Years: 14.00    Total pack years: 14.00    Types: Cigarettes    Quit date: 01/19/2011    Years since quitting: 10.6   Smokeless tobacco: Never  Substance and Sexual Activity   Alcohol use: Yes    Alcohol/week: 0.0 standard drinks of alcohol    Comment: occasionally   Drug use: No   Sexual activity: Not on file  Other Topics Concern   Not on file  Social History Narrative   Not on file   Social Determinants of Health   Financial Resource Strain: Not on file  Food Insecurity: Not on file  Transportation Needs: Not on file  Physical Activity: Not on file  Stress: Not on file  Social Connections: Not on file  Intimate Partner  Violence: Not on file     No Known Allergies   Outpatient Medications Prior to Visit  Medication Sig Dispense Refill   acetaminophen (TYLENOL) 500 MG tablet Take 500 mg by mouth every 6 (six) hours as needed.     escitalopram (LEXAPRO) 10 MG tablet Take 10 mg by mouth daily.     predniSONE (STERAPRED UNI-PAK 21 TAB) 10 MG (21) TBPK tablet Take 10 mg by mouth daily.     No facility-administered medications prior to visit.       Objective:   Physical Exam:  General appearance: 51 y.o., male, NAD, conversant  Eyes: anicteric sclerae; PERRL, tracking appropriately HENT: NCAT; MMM Neck: Trachea midline; no lymphadenopathy, no JVD Lungs: CTAB, no crackles, no wheeze, with normal respiratory effort CV: RRR, no murmur  Abdomen: Soft, non-tender; non-distended, BS present  Extremities: No peripheral edema, warm Skin: Normal turgor and texture; no rash Psych: Appropriate affect Neuro: Alert and oriented to person and place, no focal deficit     There were no vitals filed for this visit.   on *** LPM *** RA BMI Readings from Last 3 Encounters:  11/22/17 32.74 kg/m  03/25/14 36.55 kg/m  01/24/14 35.62 kg/m  Wt Readings from Last 3 Encounters:  11/22/17 248 lb 1.9 oz (112.5 kg)  03/25/14 277 lb (125.6 kg)  01/24/14 270 lb (122.5 kg)     CBC No results found for: "WBC", "RBC", "HGB", "HCT", "PLT", "MCV", "MCH", "MCHC", "RDW", "LYMPHSABS", "MONOABS", "EOSABS", "BASOSABS"  ***  Chest Imaging: CXR 09/11/21 reviewed by me unremarkable  Pulmonary Functions Testing Results:     No data to display          FeNO: ***  Pathology: ***  Echocardiogram: ***  Heart Catheterization: ***    Assessment & Plan:    Plan:      Omar Person, MD [facility] 09/25/2021 8:24 PM

## 2021-09-28 ENCOUNTER — Institutional Professional Consult (permissible substitution): Payer: BC Managed Care – PPO | Admitting: Student

## 2021-10-18 DIAGNOSIS — E119 Type 2 diabetes mellitus without complications: Secondary | ICD-10-CM | POA: Diagnosis not present

## 2021-11-18 DIAGNOSIS — E119 Type 2 diabetes mellitus without complications: Secondary | ICD-10-CM | POA: Diagnosis not present

## 2021-12-18 DIAGNOSIS — E119 Type 2 diabetes mellitus without complications: Secondary | ICD-10-CM | POA: Diagnosis not present

## 2021-12-30 DIAGNOSIS — M545 Low back pain, unspecified: Secondary | ICD-10-CM | POA: Diagnosis not present

## 2021-12-30 DIAGNOSIS — E785 Hyperlipidemia, unspecified: Secondary | ICD-10-CM | POA: Diagnosis not present

## 2021-12-30 DIAGNOSIS — F419 Anxiety disorder, unspecified: Secondary | ICD-10-CM | POA: Diagnosis not present

## 2021-12-30 DIAGNOSIS — E1169 Type 2 diabetes mellitus with other specified complication: Secondary | ICD-10-CM | POA: Diagnosis not present

## 2022-01-18 DIAGNOSIS — E119 Type 2 diabetes mellitus without complications: Secondary | ICD-10-CM | POA: Diagnosis not present

## 2022-02-08 DIAGNOSIS — E860 Dehydration: Secondary | ICD-10-CM | POA: Diagnosis not present

## 2022-02-08 DIAGNOSIS — R5383 Other fatigue: Secondary | ICD-10-CM | POA: Diagnosis not present

## 2022-02-08 DIAGNOSIS — E119 Type 2 diabetes mellitus without complications: Secondary | ICD-10-CM | POA: Diagnosis not present

## 2022-02-18 DIAGNOSIS — E119 Type 2 diabetes mellitus without complications: Secondary | ICD-10-CM | POA: Diagnosis not present

## 2022-03-19 DIAGNOSIS — E119 Type 2 diabetes mellitus without complications: Secondary | ICD-10-CM | POA: Diagnosis not present

## 2022-03-28 DIAGNOSIS — J0100 Acute maxillary sinusitis, unspecified: Secondary | ICD-10-CM | POA: Diagnosis not present

## 2022-04-02 DIAGNOSIS — E119 Type 2 diabetes mellitus without complications: Secondary | ICD-10-CM | POA: Diagnosis not present

## 2022-04-19 DIAGNOSIS — E119 Type 2 diabetes mellitus without complications: Secondary | ICD-10-CM | POA: Diagnosis not present

## 2022-04-26 DIAGNOSIS — E785 Hyperlipidemia, unspecified: Secondary | ICD-10-CM | POA: Diagnosis not present

## 2022-04-26 DIAGNOSIS — F1011 Alcohol abuse, in remission: Secondary | ICD-10-CM | POA: Diagnosis not present

## 2022-04-26 DIAGNOSIS — Z0000 Encounter for general adult medical examination without abnormal findings: Secondary | ICD-10-CM | POA: Diagnosis not present

## 2022-04-26 DIAGNOSIS — Z125 Encounter for screening for malignant neoplasm of prostate: Secondary | ICD-10-CM | POA: Diagnosis not present

## 2022-04-26 DIAGNOSIS — E1169 Type 2 diabetes mellitus with other specified complication: Secondary | ICD-10-CM | POA: Diagnosis not present

## 2022-04-26 DIAGNOSIS — F419 Anxiety disorder, unspecified: Secondary | ICD-10-CM | POA: Diagnosis not present

## 2022-05-19 DIAGNOSIS — E119 Type 2 diabetes mellitus without complications: Secondary | ICD-10-CM | POA: Diagnosis not present

## 2022-06-19 DIAGNOSIS — E119 Type 2 diabetes mellitus without complications: Secondary | ICD-10-CM | POA: Diagnosis not present

## 2023-08-28 IMAGING — CR DG ABDOMEN 2V
4 series · 4 of 4 positions shown · non-contrast
Comparison: None.

CLINICAL DATA: Abdominal bloating and pressure 1 month.

EXAM:
ABDOMEN - 2 VIEW

[t abdomen supine (1 of 2)]
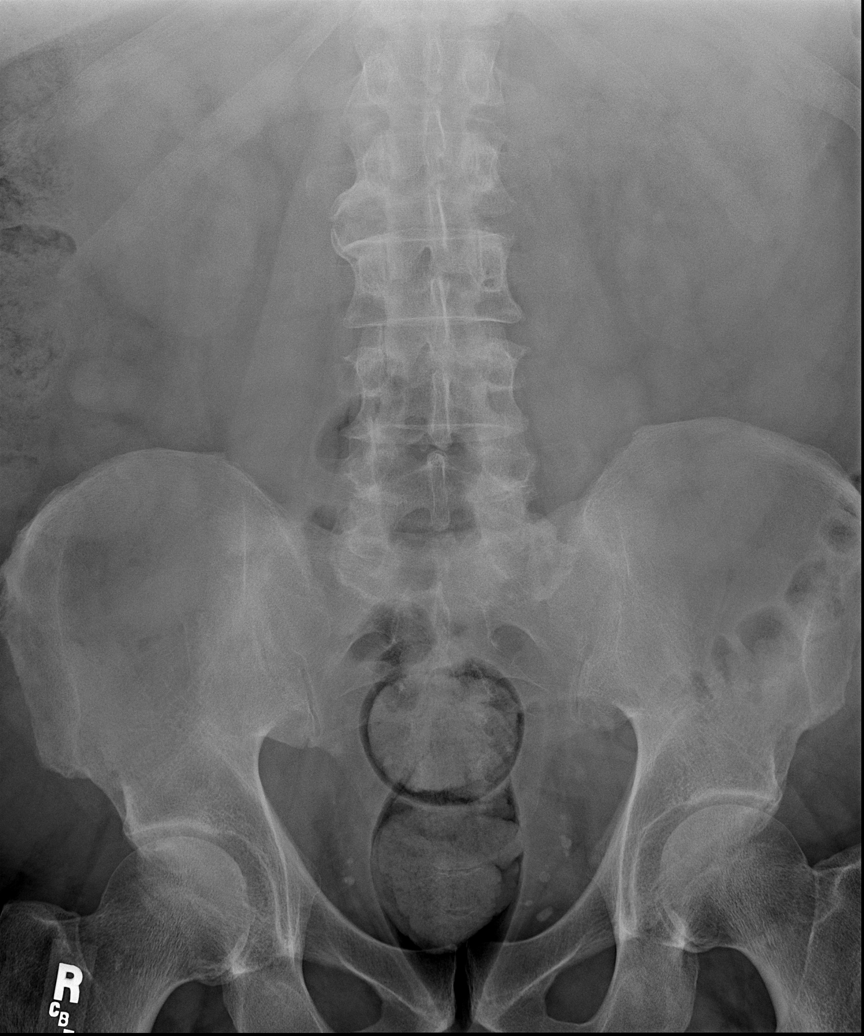

[t abdomen supine (2 of 2)]
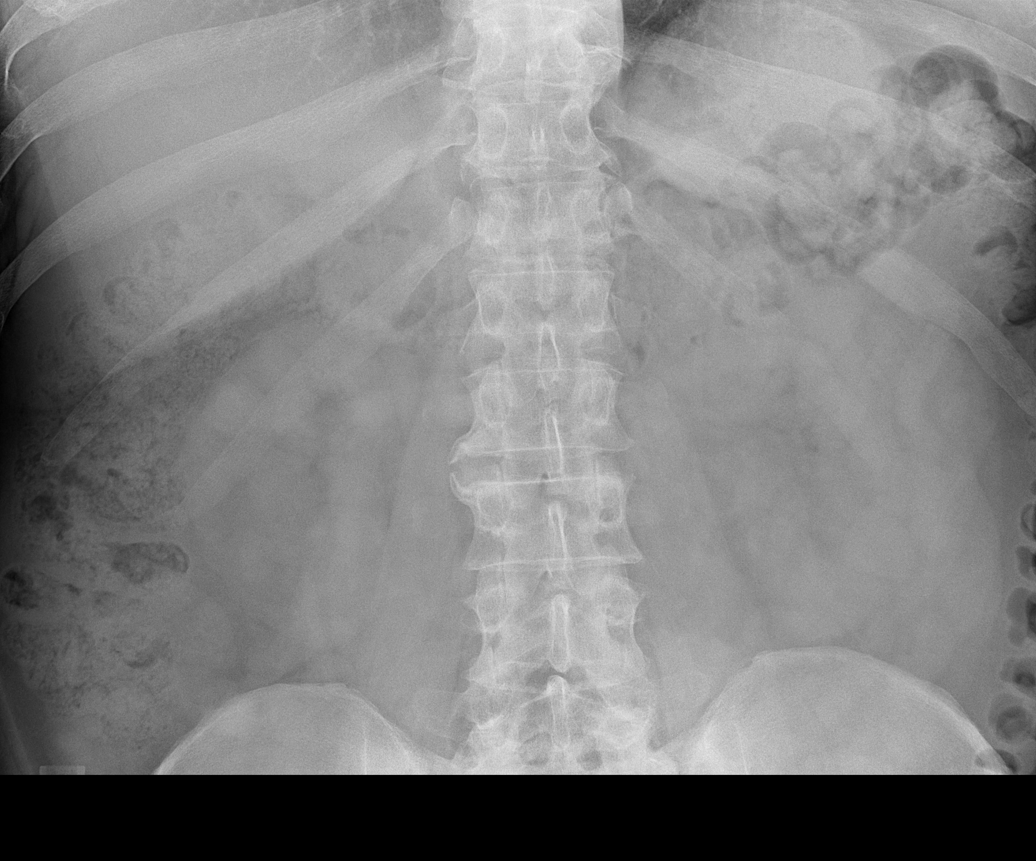

[w abdomen upright (1 of 2)]
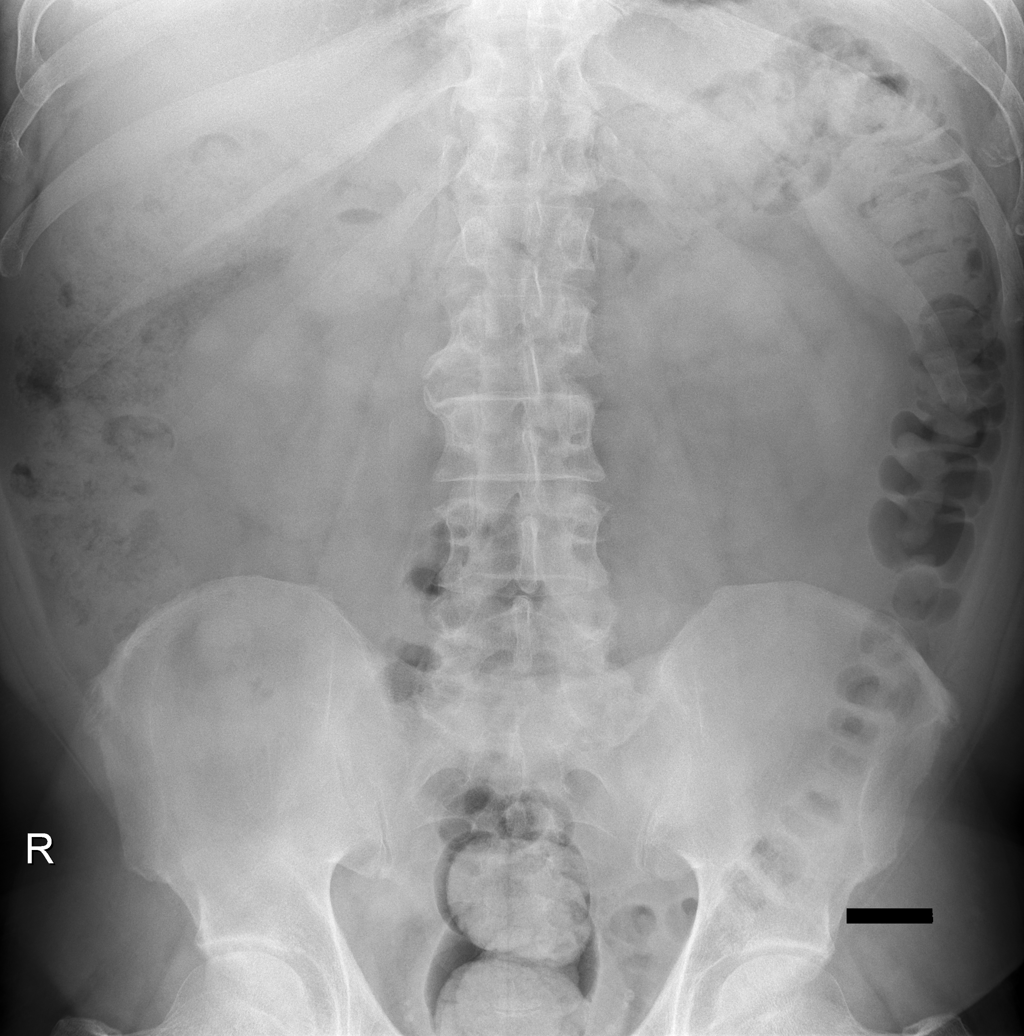

[w abdomen upright (2 of 2)]
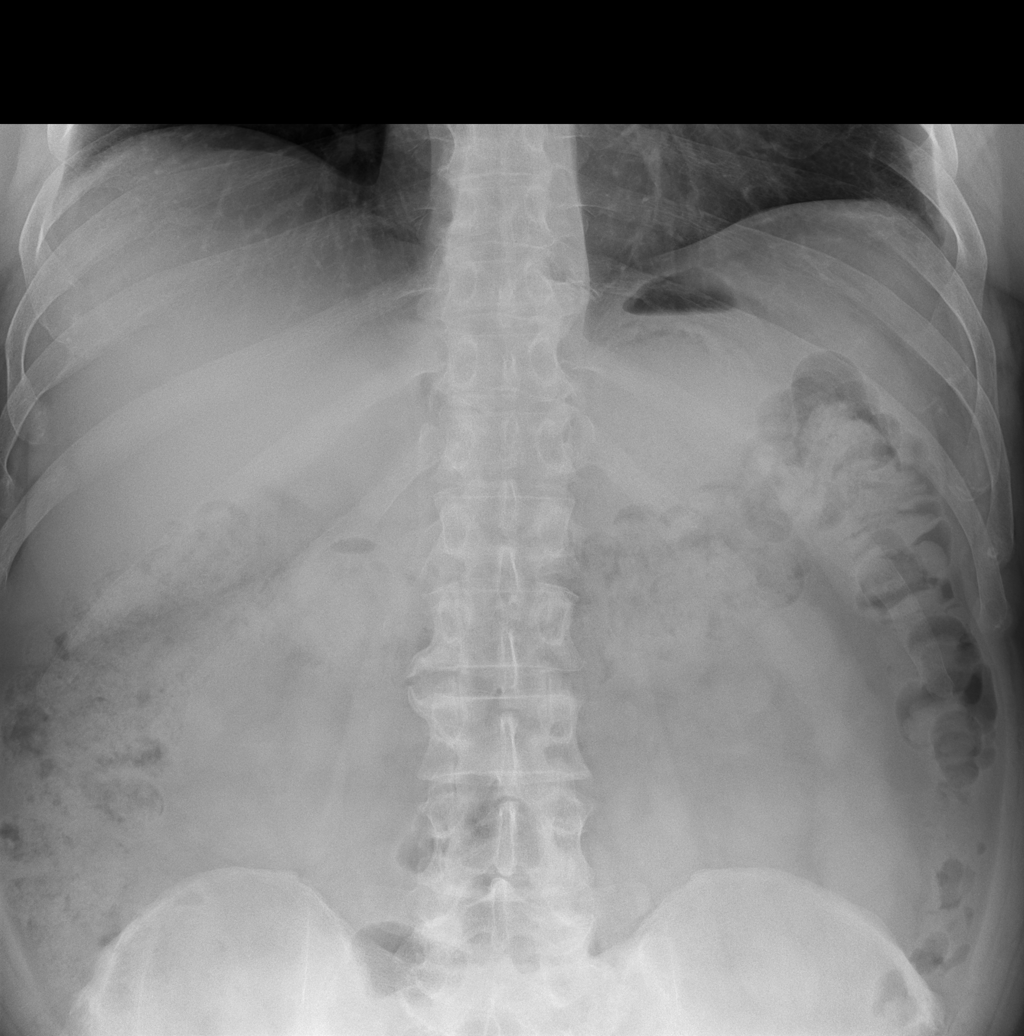

[4 of 4 positions shown; findings below may reference images not displayed]

FINDINGS: Bowel gas pattern is nonobstructive. Mild fecal retention. Mild to
moderate fecal retention throughout the colon most prominent over
the rectosigmoid colon. No free peritoneal air. Remaining soft
tissues are unremarkable. There are mild degenerative changes of the
spine and hips. Several pelvic phleboliths are present.
IMPRESSION: Nonobstructive bowel gas pattern with mild to moderate fecal
retention throughout the colon.
